# Patient Record
Sex: Female | Born: 1935 | Smoking: Never smoker
Health system: Southern US, Community
[De-identification: ages and names within clinical notes are randomized; demographics above are authoritative.]

## PROBLEM LIST (undated history)

## (undated) DIAGNOSIS — E1165 Type 2 diabetes mellitus with hyperglycemia: Secondary | ICD-10-CM

## (undated) DIAGNOSIS — M1990 Unspecified osteoarthritis, unspecified site: Secondary | ICD-10-CM

## (undated) DIAGNOSIS — I10 Essential (primary) hypertension: Secondary | ICD-10-CM

## (undated) DIAGNOSIS — IMO0001 Reserved for inherently not codable concepts without codable children: Secondary | ICD-10-CM

## (undated) DIAGNOSIS — Z794 Long term (current) use of insulin: Secondary | ICD-10-CM

## (undated) DIAGNOSIS — J45909 Unspecified asthma, uncomplicated: Secondary | ICD-10-CM

## (undated) DIAGNOSIS — M109 Gout, unspecified: Secondary | ICD-10-CM

## (undated) DIAGNOSIS — E785 Hyperlipidemia, unspecified: Secondary | ICD-10-CM

## (undated) DIAGNOSIS — N183 Chronic kidney disease, stage 3 unspecified: Secondary | ICD-10-CM

## (undated) DIAGNOSIS — I5032 Chronic diastolic (congestive) heart failure: Secondary | ICD-10-CM

## (undated) HISTORY — PX: OVARIAN CYST REMOVAL: SHX89

---

## 2003-10-24 ENCOUNTER — Other Ambulatory Visit: Payer: Self-pay

## 2004-02-10 ENCOUNTER — Other Ambulatory Visit: Payer: Self-pay

## 2004-02-15 ENCOUNTER — Other Ambulatory Visit: Payer: Self-pay

## 2004-08-03 ENCOUNTER — Ambulatory Visit: Payer: Self-pay | Admitting: Family Medicine

## 2004-08-25 ENCOUNTER — Ambulatory Visit: Payer: Self-pay | Admitting: Family Medicine

## 2005-06-05 ENCOUNTER — Emergency Department: Payer: Self-pay | Admitting: Emergency Medicine

## 2005-06-20 ENCOUNTER — Ambulatory Visit: Payer: Self-pay | Admitting: Nephrology

## 2005-07-30 ENCOUNTER — Ambulatory Visit: Payer: Self-pay | Admitting: Family Medicine

## 2005-11-16 ENCOUNTER — Other Ambulatory Visit: Payer: Self-pay

## 2005-11-17 ENCOUNTER — Inpatient Hospital Stay: Payer: Self-pay

## 2006-01-21 ENCOUNTER — Other Ambulatory Visit: Payer: Self-pay

## 2006-01-21 ENCOUNTER — Emergency Department: Payer: Self-pay | Admitting: Emergency Medicine

## 2006-12-16 ENCOUNTER — Emergency Department: Payer: Self-pay | Admitting: Emergency Medicine

## 2007-01-05 ENCOUNTER — Emergency Department: Payer: Self-pay

## 2007-01-05 ENCOUNTER — Ambulatory Visit: Payer: Self-pay

## 2007-07-20 ENCOUNTER — Emergency Department: Payer: Self-pay | Admitting: Internal Medicine

## 2007-07-20 ENCOUNTER — Other Ambulatory Visit: Payer: Self-pay

## 2011-03-21 ENCOUNTER — Encounter: Payer: Self-pay | Admitting: Nurse Practitioner

## 2011-03-21 ENCOUNTER — Encounter: Payer: Self-pay | Admitting: Cardiothoracic Surgery

## 2011-03-21 ENCOUNTER — Ambulatory Visit: Payer: Self-pay | Admitting: Nurse Practitioner

## 2011-03-28 ENCOUNTER — Encounter: Payer: Self-pay | Admitting: Cardiothoracic Surgery

## 2011-03-28 ENCOUNTER — Encounter: Payer: Self-pay | Admitting: Nurse Practitioner

## 2011-04-16 ENCOUNTER — Ambulatory Visit: Payer: Self-pay

## 2011-04-27 ENCOUNTER — Encounter: Payer: Self-pay | Admitting: Cardiothoracic Surgery

## 2011-04-27 ENCOUNTER — Encounter: Payer: Self-pay | Admitting: Nurse Practitioner

## 2011-05-31 ENCOUNTER — Ambulatory Visit: Payer: Self-pay | Admitting: Surgery

## 2011-06-01 ENCOUNTER — Encounter: Payer: Self-pay | Admitting: Cardiothoracic Surgery

## 2011-06-28 ENCOUNTER — Encounter: Payer: Self-pay | Admitting: Nurse Practitioner

## 2011-06-28 ENCOUNTER — Encounter: Payer: Self-pay | Admitting: Cardiothoracic Surgery

## 2011-07-26 ENCOUNTER — Encounter: Payer: Self-pay | Admitting: Nurse Practitioner

## 2011-07-26 ENCOUNTER — Encounter: Payer: Self-pay | Admitting: Cardiothoracic Surgery

## 2011-07-29 ENCOUNTER — Ambulatory Visit: Payer: Self-pay | Admitting: Internal Medicine

## 2011-08-26 ENCOUNTER — Encounter: Payer: Self-pay | Admitting: Cardiothoracic Surgery

## 2011-08-26 ENCOUNTER — Encounter: Payer: Self-pay | Admitting: Nurse Practitioner

## 2011-08-28 ENCOUNTER — Ambulatory Visit: Payer: Self-pay | Admitting: Cardiothoracic Surgery

## 2011-09-25 ENCOUNTER — Encounter: Payer: Self-pay | Admitting: Nurse Practitioner

## 2011-09-25 ENCOUNTER — Encounter: Payer: Self-pay | Admitting: Cardiothoracic Surgery

## 2011-11-14 ENCOUNTER — Ambulatory Visit: Payer: Self-pay | Admitting: Surgery

## 2011-11-14 DIAGNOSIS — E119 Type 2 diabetes mellitus without complications: Secondary | ICD-10-CM

## 2011-11-14 LAB — CBC WITH DIFFERENTIAL/PLATELET
Eosinophil #: 0.1 10*3/uL (ref 0.0–0.7)
Eosinophil %: 2.3 %
HCT: 35.3 % (ref 35.0–47.0)
Lymphocyte #: 2 10*3/uL (ref 1.0–3.6)
MCV: 86 fL (ref 80–100)
Monocyte %: 6.9 %
Neutrophil #: 3.6 10*3/uL (ref 1.4–6.5)
Neutrophil %: 58.6 %
Platelet: 236 10*3/uL (ref 150–440)
RBC: 4.08 10*6/uL (ref 3.80–5.20)
WBC: 6.2 10*3/uL (ref 3.6–11.0)

## 2011-11-14 LAB — BASIC METABOLIC PANEL
Anion Gap: 5 — ABNORMAL LOW (ref 7–16)
BUN: 44 mg/dL — ABNORMAL HIGH (ref 7–18)
Calcium, Total: 9 mg/dL (ref 8.5–10.1)
Co2: 29 mmol/L (ref 21–32)
EGFR (African American): 32 — ABNORMAL LOW
EGFR (Non-African Amer.): 27 — ABNORMAL LOW
Potassium: 5.2 mmol/L — ABNORMAL HIGH (ref 3.5–5.1)
Sodium: 138 mmol/L (ref 136–145)

## 2011-11-22 ENCOUNTER — Ambulatory Visit: Payer: Self-pay | Admitting: Surgery

## 2011-11-25 LAB — PATHOLOGY REPORT

## 2012-06-08 ENCOUNTER — Encounter: Payer: Self-pay | Admitting: Cardiothoracic Surgery

## 2012-06-08 ENCOUNTER — Encounter: Payer: Self-pay | Admitting: Nurse Practitioner

## 2012-06-27 ENCOUNTER — Encounter: Payer: Self-pay | Admitting: Nurse Practitioner

## 2012-06-27 ENCOUNTER — Encounter: Payer: Self-pay | Admitting: Cardiothoracic Surgery

## 2012-07-16 ENCOUNTER — Ambulatory Visit: Payer: Self-pay | Admitting: Internal Medicine

## 2012-07-25 ENCOUNTER — Encounter: Payer: Self-pay | Admitting: Cardiothoracic Surgery

## 2012-07-25 ENCOUNTER — Encounter: Payer: Self-pay | Admitting: Nurse Practitioner

## 2012-08-25 ENCOUNTER — Encounter: Payer: Self-pay | Admitting: Cardiothoracic Surgery

## 2012-08-25 ENCOUNTER — Encounter: Payer: Self-pay | Admitting: Nurse Practitioner

## 2012-09-24 ENCOUNTER — Encounter: Payer: Self-pay | Admitting: Nurse Practitioner

## 2012-09-24 ENCOUNTER — Encounter: Payer: Self-pay | Admitting: Cardiothoracic Surgery

## 2012-10-25 ENCOUNTER — Encounter: Payer: Self-pay | Admitting: Nurse Practitioner

## 2012-10-25 ENCOUNTER — Encounter: Payer: Self-pay | Admitting: Cardiothoracic Surgery

## 2012-11-24 ENCOUNTER — Encounter: Payer: Self-pay | Admitting: Cardiothoracic Surgery

## 2012-11-24 ENCOUNTER — Encounter: Payer: Self-pay | Admitting: Nurse Practitioner

## 2013-01-01 IMAGING — CR RIGHT HIP - COMPLETE 2+ VIEW
1 series · 2 of 2 positions shown · non-contrast
Comparison: none

REASON FOR EXAM: rapidly increasing wound depth of pressure ulcer  Fax to
Dr. Mercy [phone]
COMMENTS:

PROCEDURE:     DXR - DXR HIP RIGHT COMPLETE  - August 28, 2011  [DATE]
RESULT:     AP and frog-leg views of the right hip are obtained. An show the
femoral head located in the acetabulum. Degenerative changes are seen. No
definite fractures identified.

[Series 3: t hip ap right · 0.14mm/px · 2 of 2 slices shown]
[im 1/2]
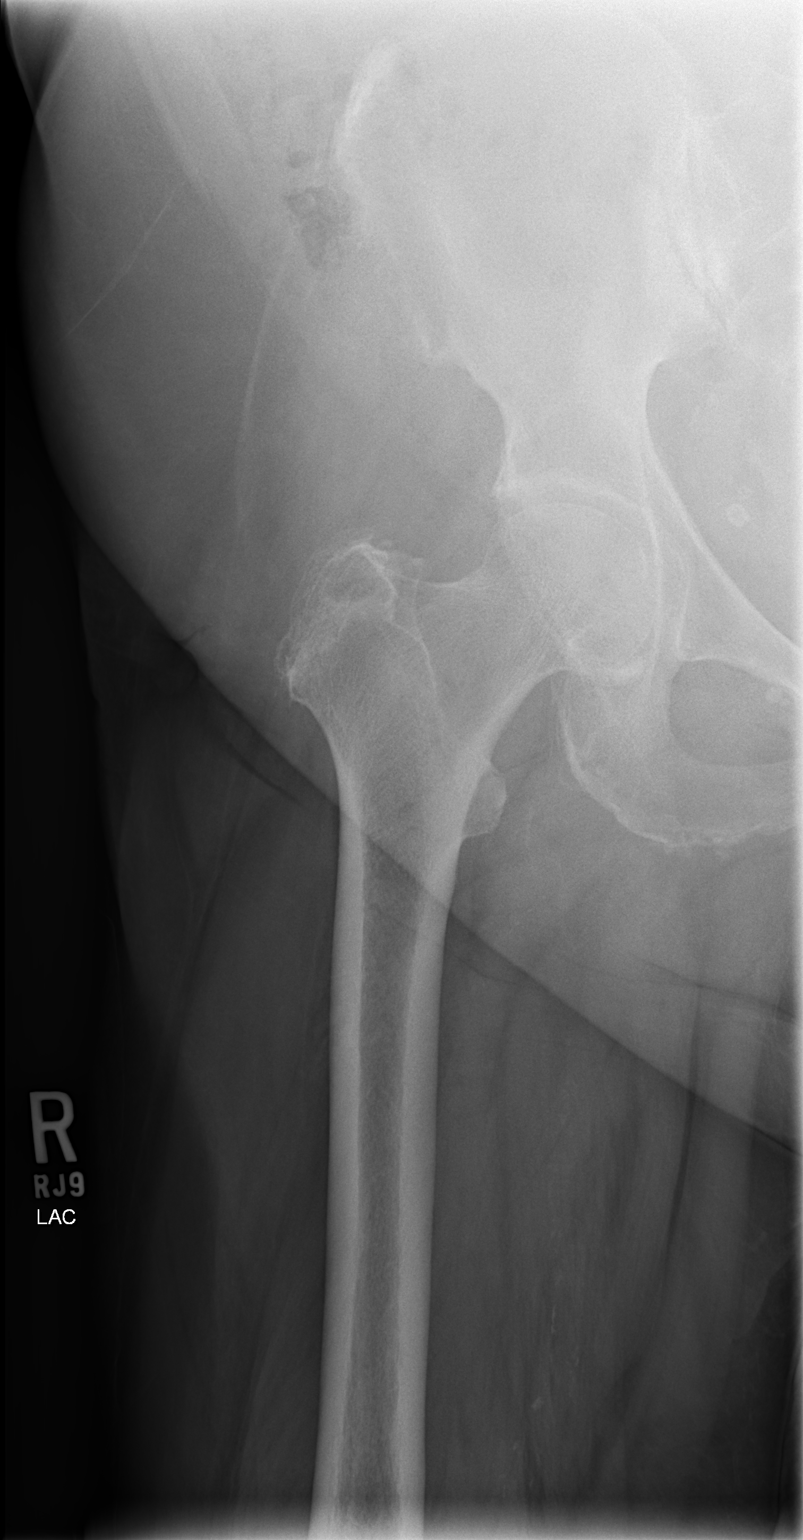
[im 2/2]
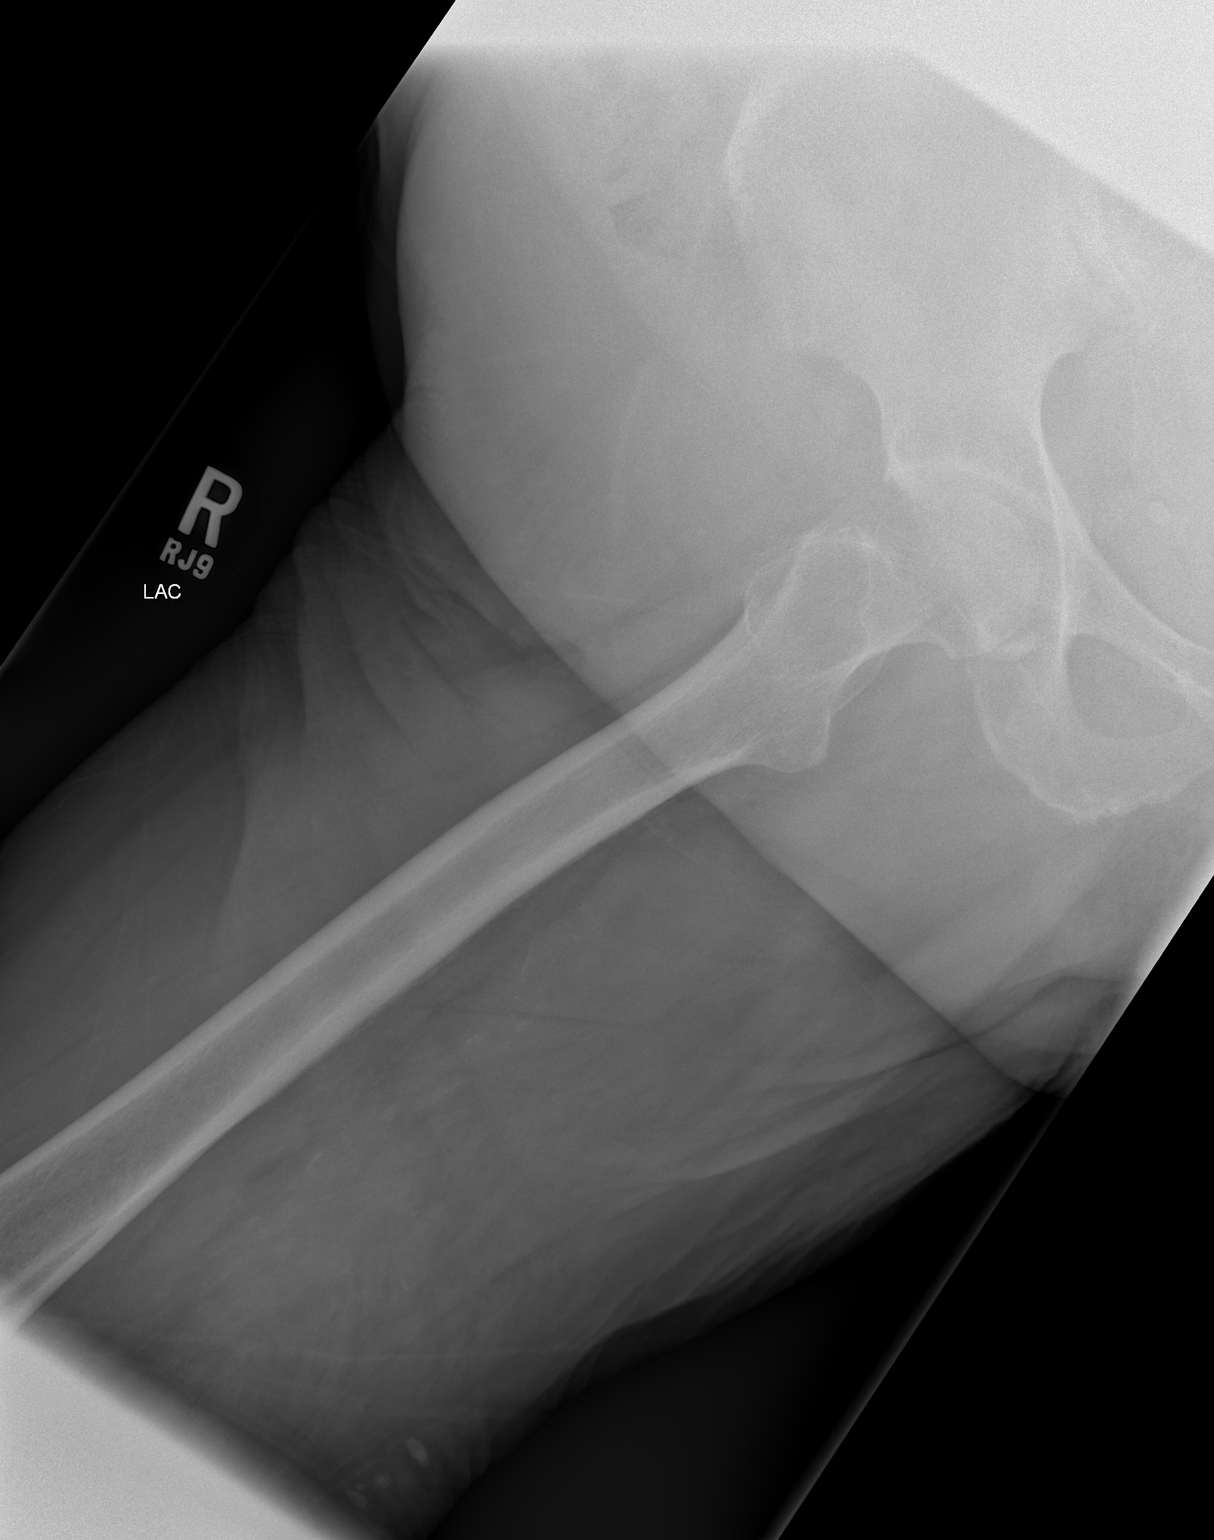

[2 of 2 positions shown; findings below may reference images not displayed]

IMPRESSION: 1. No acute bony abnormality. DJD present. No evidence of bony destruction.

## 2013-05-29 ENCOUNTER — Emergency Department: Payer: Self-pay | Admitting: Emergency Medicine

## 2014-09-18 NOTE — Op Note (Signed)
PATIENT NAME:  [name], [name] MR#:  [id number] DATE OF BIRTH:  [birth year]  DATE OF PROCEDURE:  11/22/2011  PREOPERATIVE DIAGNOSIS: Right hip decubitus ulcer.   POSTOPERATIVE DIAGNOSIS: Right hip decubitus ulcer.   PROCEDURE PERFORMED: Primary excision of decubitus ulcer measuring 7 cm with complex layered closure 7 cm long.   SURGEON: Aalina Brege A. Egbert Garibaldi, MD FACS  ASSISTANT: None.   ANESTHESIA: General and 20 mL of 0.25% plain Marcaine.   INDICATIONS: This is an 81 year old white female with a long-standing right hip decubitus ulcer overlying the greater trochanter. Recent MRI demonstrated no evidence of osteomyelitis. Despite aggressive wound care she has a persistent tract extending superiorly from the wound bed which is nonhealing. As such she was brought to the Operating Room for excision of the tract, biopsy and possible wound VAC versus primary closure.   FINDINGS: There was approximately 3 to 4 cm long tract from the center of the wound extending superiorly to the iliotibial band. No involvement of the hip space. There was no evidence of undrained purulence. No signs of infection.   SPECIMENS: As described above.   DRAINS: 7 French Jackson-Pratt.   DESCRIPTION OF PROCEDURE: With the patient in the supine position, general oral endotracheal anesthesia was induced. The patient was then positioned and padded in left side down decubitus position with arm board, axillary roll, appropriate padding and beanbag support. The existing dressing was removed. The packing was removed. The wound around the right hip was sterilely prepped and draped with Betadine solution.   Timeout was observed. I injected dilute methylene blue into the tract. An elliptical incision encompassing the wound measuring approximately 7 cm was fashioned with scalpel and carried down with sharp dissection through scar encompassing the entire tract. The specimen was submitted to pathology. Hemostasis being obtained the wound was  palpated. There was no evidence of further extension into the iliotibial band. All traces of the methylene blue were removed. The wound was then irrigated. Point hemostasis was obtained.   A total of 20 mL of 0.25% plain Marcaine was infiltrated along the wound. In order to obtain a tension-free closure the subcutaneous tissues were released off the fascia circumferentially with electrocautery. A 7 French Jackson-Pratt drain was directed into the space and exited the leg inferiorly. Drain site was secured with 3-0 nylon. The wound was then closed in multiple layers with 2-0 Vicryl, 3-0 Vicryl and 3-0 nylon in simple and vertical mattress orientation in the skin. A sterile occlusive compressive dressing was then placed. The patient was then subsequently returned supine, extubated and taken to the recovery room in stable and satisfactory condition by anesthesia services.   ____________________________ Redge Gainer Egbert Garibaldi, MD FACS mab:cms D: 11/22/2011 09:08:54 ET T: 11/22/2011 10:54:43 ET JOB#: [id number]  cc: Loraine Leriche A. Egbert Garibaldi, MD, <Dictator> Hyman Hopes, MD [facility] Jeremaine Maraj A Raidyn Wassink MD ELECTRONICALLY SIGNED 11/22/2011 18:01

## 2015-04-06 ENCOUNTER — Other Ambulatory Visit: Payer: Self-pay | Admitting: Otolaryngology

## 2015-04-06 DIAGNOSIS — H9202 Otalgia, left ear: Secondary | ICD-10-CM

## 2015-04-06 DIAGNOSIS — M542 Cervicalgia: Secondary | ICD-10-CM

## 2015-04-13 ENCOUNTER — Ambulatory Visit: Payer: Medicare Other | Attending: Otolaryngology

## 2015-06-20 ENCOUNTER — Other Ambulatory Visit: Payer: Self-pay | Admitting: Internal Medicine

## 2015-06-20 DIAGNOSIS — R590 Localized enlarged lymph nodes: Secondary | ICD-10-CM

## 2015-06-26 ENCOUNTER — Ambulatory Visit: Payer: Medicare Other

## 2015-10-31 ENCOUNTER — Inpatient Hospital Stay
Admission: EM | Admit: 2015-10-31 | Discharge: 2015-11-05 | DRG: 291 | Disposition: A | Discharge status: Home / Self Care | Payer: Medicare Other | Attending: Internal Medicine | Admitting: Internal Medicine

## 2015-10-31 ENCOUNTER — Encounter: Payer: Self-pay | Admitting: Internal Medicine

## 2015-10-31 ENCOUNTER — Emergency Department: Payer: Medicare Other

## 2015-10-31 DIAGNOSIS — Z882 Allergy status to sulfonamides status: Secondary | ICD-10-CM

## 2015-10-31 DIAGNOSIS — D638 Anemia in other chronic diseases classified elsewhere: Secondary | ICD-10-CM | POA: Diagnosis present

## 2015-10-31 DIAGNOSIS — Z808 Family history of malignant neoplasm of other organs or systems: Secondary | ICD-10-CM

## 2015-10-31 DIAGNOSIS — N183 Chronic kidney disease, stage 3 unspecified: Secondary | ICD-10-CM | POA: Diagnosis present

## 2015-10-31 DIAGNOSIS — E1122 Type 2 diabetes mellitus with diabetic chronic kidney disease: Secondary | ICD-10-CM | POA: Diagnosis present

## 2015-10-31 DIAGNOSIS — M1990 Unspecified osteoarthritis, unspecified site: Secondary | ICD-10-CM | POA: Diagnosis present

## 2015-10-31 DIAGNOSIS — E785 Hyperlipidemia, unspecified: Secondary | ICD-10-CM | POA: Diagnosis not present

## 2015-10-31 DIAGNOSIS — H9202 Otalgia, left ear: Secondary | ICD-10-CM | POA: Diagnosis present

## 2015-10-31 DIAGNOSIS — I5032 Chronic diastolic (congestive) heart failure: Secondary | ICD-10-CM | POA: Diagnosis present

## 2015-10-31 DIAGNOSIS — R9431 Abnormal electrocardiogram [ECG] [EKG]: Secondary | ICD-10-CM | POA: Diagnosis present

## 2015-10-31 DIAGNOSIS — Z888 Allergy status to other drugs, medicaments and biological substances status: Secondary | ICD-10-CM

## 2015-10-31 DIAGNOSIS — I10 Essential (primary) hypertension: Secondary | ICD-10-CM | POA: Diagnosis present

## 2015-10-31 DIAGNOSIS — E1121 Type 2 diabetes mellitus with diabetic nephropathy: Secondary | ICD-10-CM | POA: Diagnosis present

## 2015-10-31 DIAGNOSIS — I509 Heart failure, unspecified: Secondary | ICD-10-CM | POA: Diagnosis not present

## 2015-10-31 DIAGNOSIS — Z6841 Body Mass Index (BMI) 40.0 and over, adult: Secondary | ICD-10-CM | POA: Diagnosis not present

## 2015-10-31 DIAGNOSIS — E875 Hyperkalemia: Secondary | ICD-10-CM | POA: Diagnosis present

## 2015-10-31 DIAGNOSIS — Z887 Allergy status to serum and vaccine status: Secondary | ICD-10-CM

## 2015-10-31 DIAGNOSIS — M26629 Arthralgia of temporomandibular joint, unspecified side: Secondary | ICD-10-CM | POA: Diagnosis present

## 2015-10-31 DIAGNOSIS — Z79899 Other long term (current) drug therapy: Secondary | ICD-10-CM

## 2015-10-31 DIAGNOSIS — R0602 Shortness of breath: Secondary | ICD-10-CM | POA: Diagnosis not present

## 2015-10-31 DIAGNOSIS — E1142 Type 2 diabetes mellitus with diabetic polyneuropathy: Secondary | ICD-10-CM | POA: Diagnosis present

## 2015-10-31 DIAGNOSIS — Z79891 Long term (current) use of opiate analgesic: Secondary | ICD-10-CM | POA: Diagnosis not present

## 2015-10-31 DIAGNOSIS — N184 Chronic kidney disease, stage 4 (severe): Secondary | ICD-10-CM | POA: Diagnosis present

## 2015-10-31 DIAGNOSIS — E1165 Type 2 diabetes mellitus with hyperglycemia: Secondary | ICD-10-CM | POA: Diagnosis present

## 2015-10-31 DIAGNOSIS — N179 Acute kidney failure, unspecified: Secondary | ICD-10-CM | POA: Diagnosis present

## 2015-10-31 DIAGNOSIS — I5033 Acute on chronic diastolic (congestive) heart failure: Secondary | ICD-10-CM | POA: Diagnosis present

## 2015-10-31 DIAGNOSIS — H9209 Otalgia, unspecified ear: Secondary | ICD-10-CM | POA: Diagnosis present

## 2015-10-31 DIAGNOSIS — Z794 Long term (current) use of insulin: Secondary | ICD-10-CM | POA: Diagnosis not present

## 2015-10-31 DIAGNOSIS — M109 Gout, unspecified: Secondary | ICD-10-CM | POA: Diagnosis present

## 2015-10-31 DIAGNOSIS — J45909 Unspecified asthma, uncomplicated: Secondary | ICD-10-CM | POA: Diagnosis present

## 2015-10-31 DIAGNOSIS — I130 Hypertensive heart and chronic kidney disease with heart failure and stage 1 through stage 4 chronic kidney disease, or unspecified chronic kidney disease: Principal | ICD-10-CM | POA: Diagnosis present

## 2015-10-31 DIAGNOSIS — Z880 Allergy status to penicillin: Secondary | ICD-10-CM

## 2015-10-31 DIAGNOSIS — Z9889 Other specified postprocedural states: Secondary | ICD-10-CM

## 2015-10-31 DIAGNOSIS — E119 Type 2 diabetes mellitus without complications: Secondary | ICD-10-CM

## 2015-10-31 DIAGNOSIS — R600 Localized edema: Secondary | ICD-10-CM | POA: Diagnosis present

## 2015-10-31 HISTORY — DX: Chronic kidney disease, stage 3 unspecified: N18.30

## 2015-10-31 HISTORY — DX: Unspecified osteoarthritis, unspecified site: M19.90

## 2015-10-31 HISTORY — DX: Hyperlipidemia, unspecified: E78.5

## 2015-10-31 HISTORY — DX: Gout, unspecified: M10.9

## 2015-10-31 HISTORY — DX: Long term (current) use of insulin: Z79.4

## 2015-10-31 HISTORY — DX: Type 2 diabetes mellitus with hyperglycemia: E11.65

## 2015-10-31 HISTORY — DX: Chronic diastolic (congestive) heart failure: I50.32

## 2015-10-31 HISTORY — DX: Chronic kidney disease, stage 3 (moderate): N18.3

## 2015-10-31 HISTORY — DX: Unspecified asthma, uncomplicated: J45.909

## 2015-10-31 HISTORY — DX: Morbid (severe) obesity due to excess calories: E66.01

## 2015-10-31 HISTORY — DX: Essential (primary) hypertension: I10

## 2015-10-31 HISTORY — DX: Reserved for inherently not codable concepts without codable children: IMO0001

## 2015-10-31 LAB — CBC
HEMATOCRIT: 34.4 % — AB (ref 35.0–47.0)
Hemoglobin: 10.9 g/dL — ABNORMAL LOW (ref 12.0–16.0)
MCH: 26.7 pg (ref 26.0–34.0)
MCHC: 31.6 g/dL — AB (ref 32.0–36.0)
MCV: 84.6 fL (ref 80.0–100.0)
Platelets: 225 10*3/uL (ref 150–440)
RBC: 4.06 MIL/uL (ref 3.80–5.20)
RDW: 18.6 % — AB (ref 11.5–14.5)
WBC: 8 10*3/uL (ref 3.6–11.0)

## 2015-10-31 LAB — BASIC METABOLIC PANEL
Anion gap: 7 (ref 5–15)
BUN: 82 mg/dL — ABNORMAL HIGH (ref 6–20)
CALCIUM: 8.7 mg/dL — AB (ref 8.9–10.3)
CO2: 24 mmol/L (ref 22–32)
CREATININE: 2.18 mg/dL — AB (ref 0.44–1.00)
Chloride: 110 mmol/L (ref 101–111)
GFR calc non Af Amer: 20 mL/min — ABNORMAL LOW (ref >60)
GFR, EST AFRICAN AMERICAN: 23 mL/min — AB (ref >60)
Glucose, Bld: 133 mg/dL — ABNORMAL HIGH (ref 65–99)
Potassium: 5.7 mmol/L — ABNORMAL HIGH (ref 3.5–5.1)
Sodium: 141 mmol/L (ref 135–145)

## 2015-10-31 LAB — GLUCOSE, CAPILLARY: GLUCOSE-CAPILLARY: 124 mg/dL — AB (ref 65–99)

## 2015-10-31 LAB — TROPONIN I: TROPONIN I: 0.03 ng/mL (ref <0.031)

## 2015-10-31 LAB — BRAIN NATRIURETIC PEPTIDE: B Natriuretic Peptide: 183 pg/mL — ABNORMAL HIGH (ref 0.0–100.0)

## 2015-10-31 MED ORDER — ONDANSETRON HCL 4 MG/2ML IJ SOLN: 4.0000 mg | Freq: Four times a day (QID) | INTRAMUSCULAR | Status: DC | PRN

## 2015-10-31 MED ORDER — OXYCODONE-ACETAMINOPHEN 5-325 MG PO TABS
1.0000 | ORAL_TABLET | Freq: Four times a day (QID) | ORAL | Status: DC | PRN
  Administered 2015-11-01: 1 via ORAL
  Filled 2015-10-31: qty 1

## 2015-10-31 MED ORDER — ONDANSETRON HCL 4 MG PO TABS: 4.0000 mg | ORAL_TABLET | Freq: Four times a day (QID) | ORAL | Status: DC | PRN

## 2015-10-31 MED ORDER — LABETALOL HCL 5 MG/ML IV SOLN: 10.0000 mg | INTRAVENOUS | Status: DC | PRN

## 2015-10-31 MED ORDER — METOPROLOL TARTRATE 50 MG PO TABS
50.0000 mg | ORAL_TABLET | Freq: Two times a day (BID) | ORAL | Status: DC
  Administered 2015-11-01 – 2015-11-05 (×10): 50 mg via ORAL
  Filled 2015-10-31 (×10): qty 1

## 2015-10-31 MED ORDER — ALBUTEROL SULFATE (2.5 MG/3ML) 0.083% IN NEBU
3.0000 mL | INHALATION_SOLUTION | RESPIRATORY_TRACT | Status: DC | PRN
  Administered 2015-11-01: 3 mL via RESPIRATORY_TRACT
  Filled 2015-10-31: qty 3

## 2015-10-31 MED ORDER — ACETAMINOPHEN 650 MG RE SUPP: 650.0000 mg | Freq: Four times a day (QID) | RECTAL | Status: DC | PRN

## 2015-10-31 MED ORDER — INSULIN ASPART 100 UNIT/ML ~~LOC~~ SOLN: 0.0000 [IU] | Freq: Every day | SUBCUTANEOUS | Status: DC

## 2015-10-31 MED ORDER — FUROSEMIDE 10 MG/ML IJ SOLN
40.0000 mg | Freq: Once | INTRAMUSCULAR | Status: AC
  Administered 2015-10-31: 40 mg via INTRAVENOUS
  Filled 2015-10-31: qty 4

## 2015-10-31 MED ORDER — INSULIN ASPART 100 UNIT/ML ~~LOC~~ SOLN
0.0000 [IU] | Freq: Three times a day (TID) | SUBCUTANEOUS | Status: DC
  Administered 2015-11-01: 3 [IU] via SUBCUTANEOUS
  Filled 2015-10-31: qty 3

## 2015-10-31 MED ORDER — ACETAMINOPHEN 325 MG PO TABS: 650.0000 mg | ORAL_TABLET | Freq: Four times a day (QID) | ORAL | Status: DC | PRN

## 2015-10-31 MED ORDER — GABAPENTIN 300 MG PO CAPS
300.0000 mg | ORAL_CAPSULE | Freq: Two times a day (BID) | ORAL | Status: DC
  Administered 2015-11-01 – 2015-11-05 (×10): 300 mg via ORAL
  Filled 2015-10-31 (×10): qty 1

## 2015-10-31 MED ORDER — HEPARIN SODIUM (PORCINE) 5000 UNIT/ML IJ SOLN
5000.0000 [IU] | Freq: Three times a day (TID) | INTRAMUSCULAR | Status: DC
  Administered 2015-11-01 – 2015-11-04 (×13): 5000 [IU] via SUBCUTANEOUS
  Filled 2015-10-31 (×13): qty 1

## 2015-10-31 MED ORDER — SODIUM CHLORIDE 0.9% FLUSH
3.0000 mL | Freq: Two times a day (BID) | INTRAVENOUS | Status: DC
  Administered 2015-11-01 – 2015-11-04 (×9): 3 mL via INTRAVENOUS

## 2015-10-31 NOTE — ED Notes (Signed)
Patient had increased work of breathing and de sat into the mid 80's while walking with a walker. Patient's sats increased to 91% when put back on the stretcher. Audible expiratory wheezes noted when first placed on the stretcher. Patient was able to speak in complete sentences shortly after being placed back on the stretcher.

## 2015-10-31 NOTE — ED Notes (Signed)
Pt in with swelling to area anterior to left ear states for 6 months. Was dx with infected lymph node 6 months ago and give doxycycline.  States same symptoms have returned and has increased pain to area and left ear. Pt also co shob for 2 months some shob noted at present.

## 2015-10-31 NOTE — ED Provider Notes (Signed)
[facility] Emergency Department Provider Note   ____________________________________________  Time seen: ~2025  I have reviewed the triage vital signs and the nursing notes.   HISTORY  Chief Complaint Shortness of Breath   History limited by: Not Limited   HPI [name] is a 82 y.o. female resents to the emergency department today with primary complaints of shortness of breath. The shortness of breath started yesterday evening. Has progressively gotten worse since then. Is especially worse with ambulation. The patient has also noticed increased swelling in her lower legs. She feels she had a low-grade fever 2 days ago. Has history of some lung disorder and tried her albuterol inhaler without any relief.  The patient has additional complaint of some swelling and pain to her left neck and ear.     Past Medical History  Diagnosis Date  . Diabetes (HCC)   . CKD (chronic kidney disease), stage IV (HCC)   . HLD (hyperlipidemia)   . HTN (hypertension)   . Gout     There are no active problems to display for this patient.   Past Surgical History  Procedure Laterality Date  . Ovarian cyst removal      Current Outpatient Rx  Name  Route  Sig  Dispense  Refill  . albuterol (PROVENTIL HFA;VENTOLIN HFA) 108 (90 Base) MCG/ACT inhaler   Inhalation   Inhale 2 puffs into the lungs every 4 (four) hours as needed for wheezing or shortness of breath.         Marland Kitchen atorvastatin (LIPITOR) 20 MG tablet   Oral   Take 20 mg by mouth at bedtime.         . diphenoxylate-atropine (LOMOTIL) 2.5-0.025 MG tablet   Oral   Take 1-2 tablets by mouth 4 (four) times daily as needed for diarrhea or loose stools.         . furosemide (LASIX) 40 MG tablet   Oral   Take 40 mg by mouth daily.         Marland Kitchen gabapentin (NEURONTIN) 300 MG capsule   Oral   Take 300 mg by mouth 3 (three) times daily.         . insulin aspart (NOVOLOG) 100 UNIT/ML injection  Subcutaneous   Inject 32 Units into the skin 3 (three) times daily before meals.          . insulin detemir (LEVEMIR) 100 UNIT/ML injection   Subcutaneous   Inject 72 Units into the skin at bedtime.         Marland Kitchen lisinopril (PRINIVIL,ZESTRIL) 40 MG tablet   Oral   Take 40 mg by mouth 2 (two) times daily.         . metoprolol (LOPRESSOR) 50 MG tablet   Oral   Take 50 mg by mouth 2 (two) times daily.         Marland Kitchen oxyCODONE-acetaminophen (PERCOCET/ROXICET) 5-325 MG tablet   Oral   Take 1 tablet by mouth every 6 (six) hours as needed for severe pain.            Allergies Influenza vaccines; Penicillins; and Sulfa antibiotics  No family history on file.  Social History Social History  Substance Use Topics  . Smoking status: Never Smoker   . Smokeless tobacco: Not on file  . Alcohol Use: No    Review of Systems  Constitutional: Positive for low grade fever Cardiovascular: Negative for chest pain. Respiratory: Positive for shortness of breath Gastrointestinal: Negative for abdominal pain, vomiting  and diarrhea. Neurological: Negative for headaches, focal weakness or numbness.  10-point ROS otherwise negative.  ____________________________________________   PHYSICAL EXAM:  VITAL SIGNS: ED Triage Vitals  Enc Vitals Group     BP 10/31/15 1930 163/50 mmHg     Pulse Rate 10/31/15 1930 71     Resp 10/31/15 1930 20     Temp 10/31/15 1930 98.1 F (36.7 C)     Temp Source 10/31/15 1930 Oral     SpO2 10/31/15 1930 94 %     Weight 10/31/15 1930 201 lb (91.173 kg)     Height 10/31/15 1930 4\' 11"  (1.499 m)     Head Cir --      Peak Flow --      Pain Score 10/31/15 1931 10   Constitutional: Alert and oriented. Mild increase in respiratory effort.  Eyes: Conjunctivae are normal. PERRL. Normal extraocular movements. ENT   Head: Normocephalic and atraumatic.   Nose: No congestion/rhinnorhea.   Mouth/Throat: Mucous membranes are moist.   Neck: No stridor.  Some swelling to the left cervical lymph nodes.  Cardiovascular: Normal rate, regular rhythm.  No murmurs, rubs, or gallops. Respiratory: Mild increase in respiratory effort. Auto-peeping. No wheezes/rales/rhonchi. Gastrointestinal: Soft and nontender. No distention. There is no CVA tenderness. Genitourinary: Deferred Musculoskeletal: Normal range of motion in all extremities. No joint effusions.  No lower extremity tenderness nor edema. Neurologic:  Normal speech and language. No gross focal neurologic deficits are appreciated.  Skin:  Skin is warm, dry and intact. No rash noted. Psychiatric: Mood and affect are normal. Speech and behavior are normal. Patient exhibits appropriate insight and judgment.  ____________________________________________    LABS (pertinent positives/negatives)  Labs Reviewed  CBC - Abnormal; Notable for the following:    Hemoglobin 10.9 (*)    HCT 34.4 (*)    MCHC 31.6 (*)    RDW 18.6 (*)    All other components within normal limits  BASIC METABOLIC PANEL - Abnormal; Notable for the following:    Potassium 5.7 (*)    Glucose, Bld 133 (*)    BUN 82 (*)    Creatinine, Ser 2.18 (*)    Calcium 8.7 (*)    GFR calc non Af Amer 20 (*)    GFR calc Af Amer 23 (*)    All other components within normal limits  BRAIN NATRIURETIC PEPTIDE - Abnormal; Notable for the following:    B Natriuretic Peptide 183.0 (*)    All other components within normal limits  TROPONIN I     ____________________________________________   EKG  I, 12/31/15, attending physician, personally viewed and interpreted this EKG  EKG Time: 1932 Rate: 71 Rhythm: normal sinus rhythm Axis: normal Intervals: qtc 421 QRS: LVH, q waves V3 ST changes: no st elevation Impression: abnormal ekg   ____________________________________________    RADIOLOGY  CXR IMPRESSION: Prominent interstitial markings, which may be secondary to low lung volumes. Alternatively these may  represent a mild pulmonary vascular congestion.  Left lung base atelectasis.  ____________________________________________   PROCEDURES  Procedure(s) performed: None  Critical Care performed: No  ____________________________________________   INITIAL IMPRESSION / ASSESSMENT AND PLAN / ED COURSE  Pertinent labs & imaging results that were available during my care of the patient were reviewed by me and considered in my medical decision making (see chart for details).  Patient presents to the emergency department today because of concerns for shortness of breath started yesterday evening. Chest x-ray does show some congestion. Additionally BNP was  mildly elevated. On ambulation the patient desatted into the 80s. Given these findings patient will be admitted to the hospital service for further workup and evaluation. Patient was given IV Lasix.  ____________________________________________   FINAL CLINICAL IMPRESSION(S) / ED DIAGNOSES  Final diagnoses:  Shortness of breath     Note: This dictation was prepared with Dragon dictation. Any transcriptional errors that result from this process are unintentional    Phineas Semen, MD 10/31/15 2232

## 2015-10-31 NOTE — ED Notes (Signed)
Patient was incontinent of urine. Patient was cleaned and brief and linens were changed.

## 2015-10-31 NOTE — H&P (Signed)
[facility] [place] [facility]   PATIENT NAME: [name]    MR#:  [id number]  DATE OF BIRTH:  Nov 08, 1928  DATE OF ADMISSION:  10/31/2015  PRIMARY CARE PHYSICIAN: Hyman Hopes, MD   REQUESTING/REFERRING PHYSICIAN: Derrill Kay, MD  CHIEF COMPLAINT:   Chief Complaint  Patient presents with  . Shortness of Breath    HISTORY OF PRESENT ILLNESS:  [name]  is a 82 y.o. female who presents with Several months of progressive dyspnea on exertion and shortness of breath at rest. Patient states that she is currently reached a point where she is unable to traverse even her ED room without becoming short of breath. She also states now that when she lays down flat at night she often wakes up with a sensation that she cannot catch her breath or get oxygen. She's had progressive lower extremity edema. She's had this for some time and was told it was due to her kidney disease, but it has gotten worse the last couple of months. Workup in the ED was suggestive of the same. Her BNP was mildly elevated, but her chest x-ray showed vascular congestion. Hospitalists were called for admission further evaluation. Of note she also states that she has left ear pain which is recurrent, and she feels like she has some left facial swelling along the angle of her jaw by her ear.  PAST MEDICAL HISTORY:   Past Medical History  Diagnosis Date  . Diabetes (HCC)   . CKD (chronic kidney disease), stage IV (HCC)   . HLD (hyperlipidemia)   . HTN (hypertension)   . Gout     PAST SURGICAL HISTORY:   Past Surgical History  Procedure Laterality Date  . Ovarian cyst removal      SOCIAL HISTORY:   Social History  Substance Use Topics  . Smoking status: Never Smoker   . Smokeless tobacco: Not on file  . Alcohol Use: No    FAMILY HISTORY:   Family History  Problem Relation Age of Onset  . Melanoma Mother     DRUG ALLERGIES:   Allergies  Allergen Reactions  .  Amitriptyline Nausea And Vomiting  . Influenza Vaccines Other (See Comments)    Reaction:  Unknown   . Penicillins Itching and Other (See Comments)    Has patient had a PCN reaction causing immediate rash, facial/tongue/throat swelling, SOB or lightheadedness with hypotension: No Has patient had a PCN reaction causing severe rash involving mucus membranes or skin necrosis: No Has patient had a PCN reaction that required hospitalization No Has patient had a PCN reaction occurring within the last 10 years: No If all of the above answers are "NO", then may proceed with Cephalosporin use.  . Pneumococcal Vaccines Other (See Comments)    Reaction:  Unknown   . Rosiglitazone Other (See Comments)    Reaction:  Unknown   . Sulfa Antibiotics Itching    MEDICATIONS AT HOME:   Prior to Admission medications   Medication Sig Start Date End Date Taking? Authorizing Provider  albuterol (PROVENTIL HFA;VENTOLIN HFA) 108 (90 Base) MCG/ACT inhaler Inhale 2 puffs into the lungs every 4 (four) hours as needed for wheezing or shortness of breath.   Yes Historical Provider, MD  diphenoxylate-atropine (LOMOTIL) 2.5-0.025 MG tablet Take 1-2 tablets by mouth 4 (four) times daily as needed for diarrhea or loose stools.   Yes Historical Provider, MD  furosemide (LASIX) 40 MG tablet Take 40 mg by mouth daily.   Yes  Historical Provider, MD  gabapentin (NEURONTIN) 300 MG capsule Take 300 mg by mouth 3 (three) times daily.   Yes Historical Provider, MD  insulin aspart (NOVOLOG) 100 UNIT/ML injection Inject 30 Units into the skin 3 (three) times daily before meals.    Yes Historical Provider, MD  insulin detemir (LEVEMIR) 100 UNIT/ML injection Inject 72 Units into the skin at bedtime.   Yes Historical Provider, MD  lisinopril (PRINIVIL,ZESTRIL) 40 MG tablet Take 40 mg by mouth 2 (two) times daily.   Yes Historical Provider, MD  metoprolol (LOPRESSOR) 50 MG tablet Take 50 mg by mouth 2 (two) times daily.   Yes Historical  Provider, MD  oxyCODONE-acetaminophen (PERCOCET/ROXICET) 5-325 MG tablet Take 1 tablet by mouth every 6 (six) hours as needed for severe pain.    Yes Historical Provider, MD    REVIEW OF SYSTEMS:  Review of Systems  Constitutional: Negative for fever, chills, weight loss and malaise/fatigue.  HENT: Positive for ear pain. Negative for hearing loss and tinnitus.   Eyes: Negative for blurred vision, double vision, pain and redness.  Respiratory: Positive for shortness of breath. Negative for cough and hemoptysis.   Cardiovascular: Positive for orthopnea and leg swelling. Negative for chest pain and palpitations.  Gastrointestinal: Negative for nausea, vomiting, abdominal pain, diarrhea and constipation.  Genitourinary: Negative for dysuria, frequency and hematuria.  Musculoskeletal: Negative for back pain, joint pain and neck pain.  Skin:       No acne, rash, or lesions  Neurological: Negative for dizziness, tremors, focal weakness and weakness.  Endo/Heme/Allergies: Negative for polydipsia. Does not bruise/bleed easily.  Psychiatric/Behavioral: Negative for depression. The patient is not nervous/anxious and does not have insomnia.      VITAL SIGNS:   Filed Vitals:   10/31/15 1930 10/31/15 2112  BP: 163/50 189/65  Pulse: 71 100  Temp: 98.1 F (36.7 C)   TempSrc: Oral   Resp: 20 24  Height: 4\' 11"  (1.499 m)   Weight: 91.173 kg (201 lb)   SpO2: 94% 95%   Wt Readings from Last 3 Encounters:  10/31/15 91.173 kg (201 lb)    PHYSICAL EXAMINATION:  Physical Exam  Vitals reviewed. Constitutional: She is oriented to person, place, and time. She appears well-developed and well-nourished. No distress.  HENT:  Head: Normocephalic and atraumatic.  Right Ear: External ear normal.  Left Ear: External ear normal.  Mouth/Throat: Oropharynx is clear and moist.  Eyes: Conjunctivae and EOM are normal. Pupils are equal, round, and reactive to light. No scleral icterus.  Neck: Normal range of  motion. Neck supple. No JVD present. No thyromegaly present.  Cardiovascular: Normal rate, regular rhythm and intact distal pulses.  Exam reveals no gallop and no friction rub.   No murmur heard. Respiratory: Effort normal and breath sounds normal. No respiratory distress. She has no wheezes. She has no rales.  GI: Soft. Bowel sounds are normal. She exhibits no distension. There is no tenderness.  Musculoskeletal: Normal range of motion. She exhibits no edema.  No arthritis, no gout  Lymphadenopathy:    She has no cervical adenopathy.  Neurological: She is alert and oriented to person, place, and time. No cranial nerve deficit.  No dysarthria, no aphasia  Skin: Skin is warm and dry. No rash noted. No erythema.  Left Soltis swelling  Psychiatric: She has a normal mood and affect. Her behavior is normal. Judgment and thought content normal.    LABORATORY PANEL:   CBC  Recent Labs Lab 10/31/15 1935  WBC  8.0  HGB 10.9*  HCT 34.4*  PLT 225   ------------------------------------------------------------------------------------------------------------------  Chemistries   Recent Labs Lab 10/31/15 1935  NA 141  K 5.7*  CL 110  CO2 24  GLUCOSE 133*  BUN 82*  CREATININE 2.18*  CALCIUM 8.7*   ------------------------------------------------------------------------------------------------------------------  Cardiac Enzymes  Recent Labs Lab 10/31/15 1935  TROPONINI 0.03   ------------------------------------------------------------------------------------------------------------------  RADIOLOGY:  Dg Chest 2 View  10/31/2015  CLINICAL DATA:  Shortness of breath for 2 months. EXAM: CHEST  2 VIEW COMPARISON:  07/29/2011 FINDINGS: Cardiomediastinal silhouette is normal. Mediastinal contours appear intact. There is no evidence of focal airspace consolidation, pleural effusion or pneumothorax. The lungs are low volume with exaggeration of the interstitial markings. There is  probable left lower lobe atelectasis. Osseous structures are without acute abnormality. Soft tissues are grossly normal. IMPRESSION: Prominent interstitial markings, which may be secondary to low lung volumes. Alternatively these may represent a mild pulmonary vascular congestion. Left lung base atelectasis. Electronically Signed   By: Ted Mcalpine M.D.   On: 10/31/2015 20:07    EKG:   Orders placed or performed during the hospital encounter of 10/31/15  . EKG 12-Lead  . EKG 12-Lead  . ED EKG  . ED EKG    IMPRESSION AND PLAN:  Principal Problem:   Shortness of breath - patient's symptoms seem very consistent clinically with congestive heart failure. She has no prior diagnosis of this. We will initiate workup for the same here including trending her cardiac enzymes tonight, getting echocardiogram, cardiology consult tomorrow. She was given a dose of IV Lasix in the ED, but given her kidney disease we will have to be very gentle with diuretic administration. Active Problems:   Lower extremity edema - likely related to heart failure at this considered above, workup as above   Diabetes (HCC) - siding scale insulin with corresponding glucose checks and carb modified diet   HTN (hypertension) - continue home meds, PRN antihypertensives in addition with a blood pressure goal less than 160/100   CKD (chronic kidney disease), stage IV (HCC) - avoid nephrotoxins, patient seems to be a baseline   Ear pain - will get ENT consult for further evaluation    All the records are reviewed and case discussed with ED provider. Management plans discussed with the patient and/or family.  DVT PROPHYLAXIS: SubQ heparin  GI PROPHYLAXIS: None  ADMISSION STATUS: Inpatient  CODE STATUS: Full Code Status History    This patient does not have a recorded code status. Please follow your organizational policy for patients in this situation.      TOTAL TIME TAKING CARE OF THIS PATIENT: 45 minutes.     Azizi Bally FIELDING 10/31/2015, 10:04 PM  [facility]  Office  [phone]  CC: Primary care physician; Hyman Hopes, MD

## 2015-11-01 ENCOUNTER — Inpatient Hospital Stay (HOSPITAL_COMMUNITY)
Admit: 2015-11-01 | Discharge: 2015-11-01 | Disposition: A | Discharge status: Home / Self Care | Payer: Medicare Other | Attending: Internal Medicine | Admitting: Internal Medicine

## 2015-11-01 DIAGNOSIS — J45909 Unspecified asthma, uncomplicated: Secondary | ICD-10-CM | POA: Diagnosis present

## 2015-11-01 DIAGNOSIS — R0602 Shortness of breath: Secondary | ICD-10-CM

## 2015-11-01 DIAGNOSIS — N183 Chronic kidney disease, stage 3 unspecified: Secondary | ICD-10-CM | POA: Diagnosis present

## 2015-11-01 DIAGNOSIS — R9431 Abnormal electrocardiogram [ECG] [EKG]: Secondary | ICD-10-CM

## 2015-11-01 DIAGNOSIS — E785 Hyperlipidemia, unspecified: Secondary | ICD-10-CM

## 2015-11-01 DIAGNOSIS — I10 Essential (primary) hypertension: Secondary | ICD-10-CM

## 2015-11-01 DIAGNOSIS — I509 Heart failure, unspecified: Secondary | ICD-10-CM

## 2015-11-01 DIAGNOSIS — I5032 Chronic diastolic (congestive) heart failure: Secondary | ICD-10-CM | POA: Diagnosis present

## 2015-11-01 LAB — GLUCOSE, CAPILLARY
GLUCOSE-CAPILLARY: 208 mg/dL — AB (ref 65–99)
GLUCOSE-CAPILLARY: 163 mg/dL — AB (ref 65–99)
GLUCOSE-CAPILLARY: 150 mg/dL — AB (ref 65–99)
Glucose-Capillary: 319 mg/dL — ABNORMAL HIGH (ref 65–99)
Glucose-Capillary: 215 mg/dL — ABNORMAL HIGH (ref 65–99)
Glucose-Capillary: 176 mg/dL — ABNORMAL HIGH (ref 65–99)

## 2015-11-01 LAB — ECHOCARDIOGRAM COMPLETE
HEIGHTINCHES: 59 in
WEIGHTICAEL: 3230.4 [oz_av]

## 2015-11-01 LAB — BASIC METABOLIC PANEL
ANION GAP: 8 (ref 5–15)
BUN: 78 mg/dL — ABNORMAL HIGH (ref 6–20)
CALCIUM: 8.9 mg/dL (ref 8.9–10.3)
CO2: 20 mmol/L — AB (ref 22–32)
CREATININE: 2.09 mg/dL — AB (ref 0.44–1.00)
Chloride: 111 mmol/L (ref 101–111)
GFR, EST AFRICAN AMERICAN: 24 mL/min — AB (ref >60)
GFR, EST NON AFRICAN AMERICAN: 21 mL/min — AB (ref >60)
Glucose, Bld: 238 mg/dL — ABNORMAL HIGH (ref 65–99)
Potassium: 5.7 mmol/L — ABNORMAL HIGH (ref 3.5–5.1)
SODIUM: 139 mmol/L (ref 135–145)

## 2015-11-01 LAB — HEMOGLOBIN A1C: HEMOGLOBIN A1C: 7.8 % — AB (ref 4.0–6.0)

## 2015-11-01 LAB — CBC
HCT: 36.5 % (ref 35.0–47.0)
HEMOGLOBIN: 11.5 g/dL — AB (ref 12.0–16.0)
MCH: 26.1 pg (ref 26.0–34.0)
MCHC: 31.6 g/dL — ABNORMAL LOW (ref 32.0–36.0)
MCV: 82.7 fL (ref 80.0–100.0)
PLATELETS: 255 10*3/uL (ref 150–440)
RBC: 4.42 MIL/uL (ref 3.80–5.20)
RDW: 18.5 % — ABNORMAL HIGH (ref 11.5–14.5)
WBC: 10.1 10*3/uL (ref 3.6–11.0)

## 2015-11-01 LAB — TROPONIN I
TROPONIN I: 0.04 ng/mL — AB (ref <0.031)
Troponin I: <0.03 ng/mL (ref <0.031)
Troponin I: 0.03 ng/mL (ref <0.031)

## 2015-11-01 MED ORDER — LOPERAMIDE HCL 2 MG PO CAPS
2.0000 mg | ORAL_CAPSULE | ORAL | Status: DC | PRN
  Administered 2015-11-01 (×2): 2 mg via ORAL
  Filled 2015-11-01 (×2): qty 1

## 2015-11-01 MED ORDER — HYDRALAZINE HCL 20 MG/ML IJ SOLN
10.0000 mg | INTRAMUSCULAR | Status: DC | PRN
  Administered 2015-11-01: 10 mg via INTRAVENOUS
  Filled 2015-11-01: qty 1

## 2015-11-01 MED ORDER — INSULIN ASPART 100 UNIT/ML ~~LOC~~ SOLN
10.0000 [IU] | Freq: Three times a day (TID) | SUBCUTANEOUS | Status: DC
  Administered 2015-11-01: 10 [IU] via SUBCUTANEOUS
  Filled 2015-11-01: qty 10

## 2015-11-01 MED ORDER — AMLODIPINE BESYLATE 10 MG PO TABS
10.0000 mg | ORAL_TABLET | Freq: Every day | ORAL | Status: DC
  Administered 2015-11-01 – 2015-11-03 (×3): 10 mg via ORAL
  Filled 2015-11-01 (×3): qty 1

## 2015-11-01 MED ORDER — OFLOXACIN 0.3 % OP SOLN
5.0000 [drp] | Freq: Two times a day (BID) | OPHTHALMIC | Status: DC
  Administered 2015-11-01 – 2015-11-05 (×9): 5 [drp] via OTIC
  Filled 2015-11-01 (×2): qty 5

## 2015-11-01 MED ORDER — INSULIN DETEMIR 100 UNIT/ML ~~LOC~~ SOLN
36.0000 [IU] | Freq: Every day | SUBCUTANEOUS | Status: DC
  Filled 2015-11-01: qty 0.36

## 2015-11-01 MED ORDER — FUROSEMIDE 10 MG/ML IJ SOLN
40.0000 mg | Freq: Every day | INTRAMUSCULAR | Status: DC
  Administered 2015-11-01: 40 mg via INTRAVENOUS
  Filled 2015-11-01: qty 4

## 2015-11-01 MED ORDER — INSULIN ASPART 100 UNIT/ML ~~LOC~~ SOLN
32.0000 [IU] | Freq: Three times a day (TID) | SUBCUTANEOUS | Status: DC
  Administered 2015-11-01 – 2015-11-05 (×12): 32 [IU] via SUBCUTANEOUS
  Filled 2015-11-01 (×12): qty 32

## 2015-11-01 MED ORDER — OXYCODONE-ACETAMINOPHEN 5-325 MG PO TABS
1.0000 | ORAL_TABLET | Freq: Four times a day (QID) | ORAL | Status: DC | PRN
  Administered 2015-11-01 – 2015-11-05 (×11): 1 via ORAL
  Filled 2015-11-01 (×11): qty 1

## 2015-11-01 MED ORDER — INSULIN DETEMIR 100 UNIT/ML ~~LOC~~ SOLN
72.0000 [IU] | Freq: Every day | SUBCUTANEOUS | Status: DC
  Administered 2015-11-01 – 2015-11-04 (×4): 72 [IU] via SUBCUTANEOUS
  Filled 2015-11-01 (×6): qty 0.72

## 2015-11-01 MED ORDER — INSULIN ASPART 100 UNIT/ML ~~LOC~~ SOLN
0.0000 [IU] | Freq: Three times a day (TID) | SUBCUTANEOUS | Status: DC
  Administered 2015-11-01: 11 [IU] via SUBCUTANEOUS
  Filled 2015-11-01: qty 11

## 2015-11-01 NOTE — Care Management (Signed)
Met with patient at bedside to discuss discharge planning. Patient lives at home with her son. She uses a walker. PCP is Ellin Goodie. Discussed home health with patient and the benefit of a nurse for her CHF. She replied, " I eat all the right things". She will talk to her daughter and decide. RNCM provided contact number. Will follow up

## 2015-11-01 NOTE — Progress Notes (Signed)
[facility] - [place] at [facility]   PATIENT NAME: [name]    MRN#:  [id number]  DATE OF BIRTH:  1928/08/10  SUBJECTIVE:  Hospital Day: 1 day [name] is a 82 y.o. female presenting with Shortness of Breath .   Overnight events: Some diarrhea Interval Events: Continued loose stools which is apparently chronic also noted elevated blood sugar no further complaints by the patient, patient's daughter has multiple complaints  REVIEW OF SYSTEMS:  CONSTITUTIONAL: No fever, fatigue or weakness.  EYES: No blurred or double vision.  EARS, NOSE, AND THROAT: No tinnitus Positive ear pain.  RESPIRATORY: No cough,Positive shortness of breath, denies wheezing or hemoptysis.  CARDIOVASCULAR: No chest pain, orthopnea,Positive edema.  GASTROINTESTINAL: No nausea, vomiting, positive diarrhea denies abdominal pain.  GENITOURINARY: No dysuria, hematuria.  ENDOCRINE: No polyuria, nocturia,  HEMATOLOGY: No anemia, easy bruising or bleeding SKIN: No rash or lesion. MUSCULOSKELETAL: No joint pain or arthritis.   NEUROLOGIC: No tingling, numbness, weakness.  PSYCHIATRY: No anxiety or depression.   DRUG ALLERGIES:   Allergies  Allergen Reactions  . Amitriptyline Nausea And Vomiting  . Influenza Vaccines Other (See Comments)    Reaction:  Unknown   . Penicillins Itching and Other (See Comments)    Has patient had a PCN reaction causing immediate rash, facial/tongue/throat swelling, SOB or lightheadedness with hypotension: No Has patient had a PCN reaction causing severe rash involving mucus membranes or skin necrosis: No Has patient had a PCN reaction that required hospitalization No Has patient had a PCN reaction occurring within the last 10 years: No If all of the above answers are "NO", then may proceed with Cephalosporin use.  . Pneumococcal Vaccines Other (See Comments)    Reaction:  Unknown   . Rosiglitazone Other (See Comments)    Reaction:  Unknown   .  Sulfa Antibiotics Itching    VITALS:  Blood pressure 175/53, pulse 78, temperature 97.9 F (36.6 C), temperature source Oral, resp. rate 24, height 4\' 11"  (1.499 m), weight 201 lb 14.4 oz (91.581 kg), SpO2 96 %.  PHYSICAL EXAMINATION:  VITAL SIGNS: Filed Vitals:   11/01/15 1345 11/01/15 1352  BP: 175/53   Pulse: 69 78  Temp:    Resp:     GENERAL:82 y.o.female currently in no acute distress.  HEAD: Normocephalic, atraumatic.  EYES: Pupils equal, round, reactive to light. Extraocular muscles intact. No scleral icterus.  MOUTH: Moist mucosal membrane. Dentition intact. No abscess noted.  EAR, NOSE, THROAT: Clear without exudates. No external lesions.  NECK: Supple. No thyromegaly. No nodules. No JVD.  PULMONARY: Diminished breath sounds without wheeze rails or rhonci. No use of accessory muscles, Good respiratory effort. good air entry bilaterally CHEST: Nontender to palpation.  CARDIOVASCULAR: S1 and S2. Regular rate and rhythm. No murmurs, rubs, or gallops. 1+ edema. Pedal pulses 2+ bilaterally.  GASTROINTESTINAL: Soft, nontender, nondistended. No masses. Positive bowel sounds. No hepatosplenomegaly.  MUSCULOSKELETAL: No swelling, clubbing, or edema. Range of motion full in all extremities.  NEUROLOGIC: Cranial nerves II through XII are intact. No gross focal neurological deficits. Sensation intact. Reflexes intact.  SKIN: No ulceration, lesions, rashes, or cyanosis. Skin warm and dry. Turgor intact.  PSYCHIATRIC: Mood, affect within normal limits. The patient is awake, alert and oriented x 3. Insight, judgment intact.      LABORATORY PANEL:   CBC  Recent Labs Lab 11/01/15 0340  WBC 10.1  HGB 11.5*  HCT 36.5  PLT 255   ------------------------------------------------------------------------------------------------------------------  Chemistries  Recent Labs Lab 11/01/15 0340  NA 139  K 5.7*  CL 111  CO2 20*  GLUCOSE 238*  BUN 78*  CREATININE 2.09*  CALCIUM  8.9   ------------------------------------------------------------------------------------------------------------------  Cardiac Enzymes  Recent Labs Lab 11/01/15 1240  TROPONINI <0.03   ------------------------------------------------------------------------------------------------------------------  RADIOLOGY:  Dg Chest 2 View  10/31/2015  CLINICAL DATA:  Shortness of breath for 2 months. EXAM: CHEST  2 VIEW COMPARISON:  07/29/2011 FINDINGS: Cardiomediastinal silhouette is normal. Mediastinal contours appear intact. There is no evidence of focal airspace consolidation, pleural effusion or pneumothorax. The lungs are low volume with exaggeration of the interstitial markings. There is probable left lower lobe atelectasis. Osseous structures are without acute abnormality. Soft tissues are grossly normal. IMPRESSION: Prominent interstitial markings, which may be secondary to low lung volumes. Alternatively these may represent a mild pulmonary vascular congestion. Left lung base atelectasis. Electronically Signed   By: Ted Mcalpine M.D.   On: 10/31/2015 20:07    EKG:   Orders placed or performed during the hospital encounter of 10/31/15  . EKG 12-Lead  . EKG 12-Lead  . ED EKG  . ED EKG    ASSESSMENT AND PLAN:   [name] is a 82 y.o. female presenting with Shortness of Breath . Admitted 10/31/2015 : Day #: 1 day 1. Acute kidney injury on chronic kidney disease: Continue careful diuresis consult nephrology 2. Diastolic congestive heart failure chronic: Continue diuresis cardiology input appreciated 3. Hyperkalemia: Hold lisinopril and follow potassium level IV. Type 2 diabetes poorly controlled: Appreciate that diabetic management recommendations restart basal insulin pre-meal and post meal coverage. 5. Chronic TMJ?: ENT input appreciated   All the records are reviewed and case discussed with Care Management/Social Workerr. Management plans discussed with the patient, family  and they are in agreement.  CODE STATUS: full TOTAL TIME TAKING CARE OF THIS PATIENT: 33 minutes.   POSSIBLE D/C IN 1-2DAYS, DEPENDING ON CLINICAL CONDITION.   Hower,  Mardi Mainland.D on 11/01/2015 at 2:03 PM  Between 7am to 6pm - Pager - [phone]  After 6pm: House Pager: - [phone]  [facility]  Office  [phone]  CC: Primary care physician; Hyman Hopes, MD

## 2015-11-01 NOTE — Consult Note (Signed)
[place], [place] [id number] 08/27/1928 Riley Nearing, MD  Reason for Consult: Left ear pain  Requesting Physician: Lytle Butte, MD   Consulting Physician: Riley Nearing, MD  HPI: This 82 y.o. year old female was admitted on 10/31/2015 for Shortness of breath [R06.02]. She has a long history of issues with ear pain, and has been followed by my partner Dr. Carloyn Manner. 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 She has not seen him in several months however. Looking at prior notes, some of the ear pain appears to have been attributed to temporomandibular joint issues. She denies any recent colds or drainage from the ear.  Medications:  Current Facility-Administered Medications  Medication Dose Route Frequency Provider Last Rate Last Dose  . acetaminophen (TYLENOL) tablet 650 mg  650 mg Oral Q6H PRN Lance Coon, MD       Or  . acetaminophen (TYLENOL) suppository 650 mg  650 mg Rectal Q6H PRN Lance Coon, MD      . albuterol (PROVENTIL) (2.5 MG/3ML) 0.083% nebulizer solution 3 mL  3 mL Inhalation Q4H PRN Lance Coon, MD   3 mL at 11/01/15 0302  . furosemide (LASIX) injection 40 mg  40 mg Intravenous Daily Lytle Butte, MD   40 mg at 11/01/15 0831  . gabapentin (NEURONTIN) capsule 300 mg  300 mg Oral BID Lance Coon, MD   300 mg at 11/01/15 0831  . heparin injection 5,000 Units  5,000 Units Subcutaneous Q8H Lance Coon, MD   5,000 Units at 11/01/15 0604  . hydrALAZINE (APRESOLINE) injection 10 mg  10 mg Intravenous Q4H PRN Lytle Butte, MD      . insulin aspart (novoLOG) injection 0-5 Units  0-5 Units Subcutaneous QHS Lance Coon, MD   0 Units at 11/01/15 0037  . insulin aspart (novoLOG) injection 0-9 Units  0-9 Units Subcutaneous TID WC Lance Coon, MD   3 Units at 11/01/15 0830  . labetalol (NORMODYNE,TRANDATE) injection 10 mg  10 mg Intravenous Q2H PRN Lance Coon, MD       . metoprolol (LOPRESSOR) tablet 50 mg  50 mg Oral BID Lance Coon, MD   50 mg at 11/01/15 0036  . ondansetron (ZOFRAN) tablet 4 mg  4 mg Oral Q6H PRN Lance Coon, MD       Or  . ondansetron [facility]) injection 4 mg  4 mg Intravenous Q6H PRN Lance Coon, MD      . oxyCODONE-acetaminophen (PERCOCET/ROXICET) 5-325 MG per tablet 1 tablet  1 tablet Oral Q6H PRN Lance Coon, MD      . sodium chloride flush (NS) 0.9 % injection 3 mL  3 mL Intravenous Q12H Lance Coon, MD   3 mL at 11/01/15 0831  .  Medications Prior to Admission  Medication Sig Dispense Refill  . albuterol (PROVENTIL HFA;VENTOLIN HFA) 108 (90 Base) MCG/ACT inhaler Inhale 2 puffs into the lungs every 4 (four) hours as needed for wheezing or shortness of breath.    Marland Kitchen atorvastatin (LIPITOR) 20 MG tablet Take 20 mg by mouth at bedtime.    . diphenoxylate-atropine (LOMOTIL) 2.5-0.025 MG tablet Take 1-2 tablets by mouth 4 (four) times daily as needed for diarrhea or loose stools.    . furosemide (LASIX) 40 MG tablet Take 40 mg by mouth daily.    Marland Kitchen gabapentin (NEURONTIN) 300 MG capsule Take 300 mg by mouth 3 (three)  times daily.    . insulin aspart (NOVOLOG) 100 UNIT/ML injection Inject 32 Units into the skin 3 (three) times daily before meals.     . insulin detemir (LEVEMIR) 100 UNIT/ML injection Inject 72 Units into the skin at bedtime.    Marland Kitchen lisinopril (PRINIVIL,ZESTRIL) 40 MG tablet Take 40 mg by mouth 2 (two) times daily.    . metoprolol (LOPRESSOR) 50 MG tablet Take 50 mg by mouth 2 (two) times daily.    Marland Kitchen oxyCODONE-acetaminophen (PERCOCET/ROXICET) 5-325 MG tablet Take 1 tablet by mouth every 6 (six) hours as needed for severe pain.       Allergies:  Allergies  Allergen Reactions  . Amitriptyline Nausea And Vomiting  . Influenza Vaccines Other (See Comments)    Reaction:  Unknown   . Penicillins Itching and Other (See Comments)    Has patient had a PCN reaction causing immediate rash, facial/tongue/throat swelling, SOB or  lightheadedness with hypotension: No Has patient had a PCN reaction causing severe rash involving mucus membranes or skin necrosis: No Has patient had a PCN reaction that required hospitalization No Has patient had a PCN reaction occurring within the last 10 years: No If all of the above answers are "NO", then may proceed with Cephalosporin use.  . Pneumococcal Vaccines Other (See Comments)    Reaction:  Unknown   . Rosiglitazone Other (See Comments)    Reaction:  Unknown   . Sulfa Antibiotics Itching    PMH:  Past Medical History  Diagnosis Date  . Diabetes mellitus, insulin dependent (IDDM), uncontrolled ([place])   . CKD (chronic kidney disease), stage III   . HLD (hyperlipidemia)   . Essential hypertension   . Gout   . Morbid obesity ([place])   . Chronic diastolic CHF (congestive heart failure) ([place])   . Osteoarthritis   . Asthma     Fam Hx:  Family History  Problem Relation Age of Onset  . Melanoma Mother     Soc Hx:  Social History   Social History  . Marital Status: Unknown    Spouse Name: N/A  . Number of Children: N/A  . Years of Education: N/A   Occupational History  . Not on file.   Social History Main Topics  . Smoking status: Never Smoker   . Smokeless tobacco: Not on file  . Alcohol Use: No  . Drug Use: No  . Sexual Activity: Not on file   Other Topics Concern  . Not on file   Social History Narrative    PSH:  Past Surgical History  Procedure Laterality Date  . Ovarian cyst removal    . Procedures since admission: No admission procedures for hospital encounter.  ROS: Review of systems normal other than 12 systems except per HPI.  PHYSICAL EXAM  Vitals: Blood pressure 137/39, pulse 70, temperature 98.4 F (36.9 C), temperature source Oral, resp. rate 24, height 4' 11"  (1.499 m), weight 91.581 kg (201 lb 14.4 oz), SpO2 95 %.. General: Well-developed, Obese, Well-nourished in no acute distress Mood: Mood and affect well adjusted, pleasant and  cooperative. Orientation: Grossly alert and oriented. Vocal Quality: No hoarseness. Communicates verbally. head and Face: NCAT. No facial asymmetry. No visible skin lesions. No significant facial scars. No tenderness with sinus percussion. Facial strength normal and symmetric. She does have some tenderness over the left temporomandibular joint region with movement but no palpable swelling or mass. Ears: External ears with normal landmarks, no lesions. External auditory canals revealed some moist cerumen in the  left ear but I can visualize the tympanic membrane. There may be some minor canal irritation. A small amount of cerumen is noted in the right canal. Tympanic membranes intact with good landmarks and normal mobility on pneumatic otoscopy. No middle ear effusion. Hearing: Speech reception grossly normal. Nose: External nose normal with midline dorsum and no lesions or deformity. Nasal Cavity reveals essentially midline septum with normal inferior turbinates. No significant mucosal congestion or erythema. Nasal secretions are minimal and clear. No polyps seen on anterior rhinoscopy. Oral Cavity/ Oropharynx: Lips are normal with no lesions. She is edentulous, wearing dentures. Initially she refused oral examination, and it became clear this was because she had tobacco in her mouth. Exam was somewhat limited by the amount of tobacco staining in the oral cavity as well as her gag reflex but no obvious lesions were seen. The was no sign of infection.  Indirect Laryngoscopy/Nasopharyngoscopy: Visualization of the larynx, hypopharynx and nasopharynx is not possible in this setting with routine examination. Neck: Supple and symmetric with no palpable masses, tenderness or crepitance. The trachea is midline. Thyroid gland is soft, nontender and symmetric with no masses or enlargement. Parotid and submandibular glands are soft, nontender and symmetric, without masses. Lymphatic: Cervical lymph nodes are without  palpable lymphadenopathy or tenderness. Respiratory: Normal respiratory effort without labored breathing. Cardiovascular: Carotid pulse shows regular rate and rhythm Neurologic: Cranial Nerves II through XII are grossly intact. Eyes: Gaze and Ocular Motility are grossly normal. PERRLA. No visible nystagmus.  MEDICAL DECISION MAKING: Data Review:  Results for orders placed or performed during the hospital encounter of 10/31/15 (from the past 48 hour(s))  CBC     Status: Abnormal   Collection Time: 10/31/15  7:35 PM  Result Value Ref Range   WBC 8.0 3.6 - 11.0 K/uL   RBC 4.06 3.80 - 5.20 MIL/uL   Hemoglobin 10.9 (L) 12.0 - 16.0 g/dL   HCT 34.4 (L) 35.0 - 47.0 %   MCV 84.6 80.0 - 100.0 fL   MCH 26.7 26.0 - 34.0 pg   MCHC 31.6 (L) 32.0 - 36.0 g/dL   RDW 18.6 (H) 11.5 - 14.5 %   Platelets 225 150 - 440 K/uL  Basic metabolic panel     Status: Abnormal   Collection Time: 10/31/15  7:35 PM  Result Value Ref Range   Sodium 141 135 - 145 mmol/L   Potassium 5.7 (H) 3.5 - 5.1 mmol/L   Chloride 110 101 - 111 mmol/L   CO2 24 22 - 32 mmol/L   Glucose, Bld 133 (H) 65 - 99 mg/dL   BUN 82 (H) 6 - 20 mg/dL   Creatinine, Ser 2.18 (H) 0.44 - 1.00 mg/dL   Calcium 8.7 (L) 8.9 - 10.3 mg/dL   GFR calc non Af Amer 20 (L) >60 mL/min   GFR calc Af Amer 23 (L) >60 mL/min    Comment: (NOTE) The eGFR has been calculated using the CKD EPI equation. This calculation has not been validated in all clinical situations. eGFR's persistently <60 mL/min signify possible Chronic Kidney Disease.    Anion gap 7 5 - 15  Troponin I     Status: None   Collection Time: 10/31/15  7:35 PM  Result Value Ref Range   Troponin I 0.03 <0.031 ng/mL    Comment:        NO INDICATION OF MYOCARDIAL INJURY.   Brain natriuretic peptide     Status: Abnormal   Collection Time: 10/31/15  7:35 PM  Result Value Ref Range   B Natriuretic Peptide 183.0 (H) 0.0 - 100.0 pg/mL  Glucose, capillary     Status: Abnormal   Collection  Time: 10/31/15 11:54 PM  Result Value Ref Range   Glucose-Capillary 124 (H) 65 - 99 mg/dL  Basic metabolic panel     Status: Abnormal   Collection Time: 11/01/15  3:40 AM  Result Value Ref Range   Sodium 139 135 - 145 mmol/L   Potassium 5.7 (H) 3.5 - 5.1 mmol/L   Chloride 111 101 - 111 mmol/L   CO2 20 (L) 22 - 32 mmol/L   Glucose, Bld 238 (H) 65 - 99 mg/dL   BUN 78 (H) 6 - 20 mg/dL   Creatinine, Ser 2.09 (H) 0.44 - 1.00 mg/dL   Calcium 8.9 8.9 - 10.3 mg/dL   GFR calc non Af Amer 21 (L) >60 mL/min   GFR calc Af Amer 24 (L) >60 mL/min    Comment: (NOTE) The eGFR has been calculated using the CKD EPI equation. This calculation has not been validated in all clinical situations. eGFR's persistently <60 mL/min signify possible Chronic Kidney Disease.    Anion gap 8 5 - 15  CBC     Status: Abnormal   Collection Time: 11/01/15  3:40 AM  Result Value Ref Range   WBC 10.1 3.6 - 11.0 K/uL   RBC 4.42 3.80 - 5.20 MIL/uL   Hemoglobin 11.5 (L) 12.0 - 16.0 g/dL   HCT 36.5 35.0 - 47.0 %   MCV 82.7 80.0 - 100.0 fL   MCH 26.1 26.0 - 34.0 pg   MCHC 31.6 (L) 32.0 - 36.0 g/dL   RDW 18.5 (H) 11.5 - 14.5 %   Platelets 255 150 - 440 K/uL  Glucose, capillary     Status: Abnormal   Collection Time: 11/01/15  8:05 AM  Result Value Ref Range   Glucose-Capillary 208 (H) 65 - 99 mg/dL  . Dg Chest 2 View  10/31/2015  CLINICAL DATA:  Shortness of breath for 2 months. EXAM: CHEST  2 VIEW COMPARISON:  07/29/2011 FINDINGS: Cardiomediastinal silhouette is normal. Mediastinal contours appear intact. There is no evidence of focal airspace consolidation, pleural effusion or pneumothorax. The lungs are low volume with exaggeration of the interstitial markings. There is probable left lower lobe atelectasis. Osseous structures are without acute abnormality. Soft tissues are grossly normal. IMPRESSION: Prominent interstitial markings, which may be secondary to low lung volumes. Alternatively these may represent a mild  pulmonary vascular congestion. Left lung base atelectasis. Electronically Signed   By: Fidela Salisbury M.D.   On: 10/31/2015 20:07  .  ASSESSMENT: Chronic left otalgia. More than likely there is chronic temporomandibular joint pain causing this, however the canal looks mildly irritated currently so there could be some mild component of otitis externa currently. There are no skin lesions to suggest recurrence of the chondritis.  PLAN: I will put her on some Floxin drops and have her follow-up as an outpatient with Dr. Pryor Ochoa, who is quite familiar with her ongoing issues. I also recommended she discontinue smokeless tobacco use.    Riley Nearing, MD 11/01/2015 10:06 AM

## 2015-11-01 NOTE — Evaluation (Signed)
Physical Therapy Evaluation Patient Details Name: [name] MRN: [id number] DOB: Oct 24, 1928 Today's Date: 11/01/2015   History of Present Illness  82 yo F presented to ED for SOB x2 months and  L anterior ear pain for 6 months after an infected lymphnode. She was found to have acute on chronic diastolic CHF. PMH includes DM, CKD, HLD, HTN, and gout.   Clinical Impression  Pt demonstrated generalized weakness and difficulty walking, limited by decreased endurance. She requires min guard for transfers and ambulation with FWW up to 70 ft. SpO2 remained 94 to 96% on RA with some noted SOB present. Pt demonstrated impulsiveness during mobility. HHPT recommended to address deficits of strength, endurance, balance and gait to progress towards PLOF and reduce caregiver burden of care. Pt will benefit from skilled PT services to increase functional I and mobility for safe discharge.     Follow Up Recommendations Home health PT;Supervision/Assistance - 24 hour    Equipment Recommendations  None recommended by PT;Other (comment) (pt has recommended FWW)    Recommendations for Other Services       Precautions / Restrictions Precautions Precautions: Fall Restrictions Weight Bearing Restrictions: No      Mobility  Bed Mobility               General bed mobility comments: up in recliner, NT  Transfers Overall transfer level: Needs assistance Equipment used: Rolling walker (2 wheeled) Transfers: Sit to/from [employer] Sit to Stand: Min guard Stand pivot transfers: Min guard       General transfer comment: Cues for hand placement on chair, tends to pull up on walker  Ambulation/Gait Ambulation/Gait assistance: Min guard Ambulation Distance (Feet): 75 Feet Assistive device: Rolling walker (2 wheeled) Gait Pattern/deviations: Decreased stride length;Trunk flexed Gait velocity: reduced   General Gait Details: Impulsive but steady gait with no LOB. Tends to push  FWW very far ahead of her and requires frequent cues for staying close to [facility].  Stairs            Wheelchair Mobility    Modified Rankin (Stroke Patients Only)       Balance Overall balance assessment: Needs assistance Sitting-balance support: No upper extremity supported Sitting balance-Leahy Scale: Good     Standing balance support: Bilateral upper extremity supported Standing balance-Leahy Scale: Good                               Pertinent Vitals/Pain Pain Assessment: No/denies pain    Home Living Family/patient expects to be discharged to:: Private residence Living Arrangements: Children (son) Available Help at Discharge: Family;Available PRN/intermittently Type of Home: Mobile home Home Access: Ramped entrance     Home Layout: One level Home Equipment: Walker - 2 wheels      Prior Function Level of Independence: Needs assistance   Gait / Transfers Assistance Needed: Household ambulation with FWW, assistance from family for getting in/out of car, transportation with wheelchair for long distances  ADL's / Homemaking Assistance Needed: assistance from family        Hand Dominance        Extremity/Trunk Assessment   Upper Extremity Assessment: Generalized weakness           Lower Extremity Assessment: Generalized weakness (grossly 4-/5)      Cervical / Trunk Assessment: Kyphotic  Communication   Communication: HOH  Cognition Arousal/Alertness: Awake/alert Behavior During Therapy: Impulsive Overall Cognitive Status: Within Functional Limits for tasks  assessed                      General Comments General comments (skin integrity, edema, etc.): B LE pitting edema    Exercises Other Exercises Other Exercises: Discussed importance of exercise and daily ambulation to improve endurance. Educated pt on performing ankle pumps and elevating LEs during the day to facilitate reduction of LE edema. Pt and daughter verbalized  understanding.      Assessment/Plan    PT Assessment Patient needs continued PT services  PT Diagnosis Difficulty walking;Generalized weakness   PT Problem List Decreased strength;Decreased activity tolerance;Decreased balance;Decreased mobility;Decreased safety awareness;Cardiopulmonary status limiting activity;Obesity  PT Treatment Interventions DME instruction;Gait training;Therapeutic activities;Therapeutic exercise;Balance training;Neuromuscular re-education;Patient/family education   PT Goals (Current goals can be found in the Care Plan section) Acute Rehab PT Goals Patient Stated Goal: to go home PT Goal Formulation: With patient/family Time For Goal Achievement: 11/15/15 Potential to Achieve Goals: Fair    Frequency Min 2X/week   Barriers to discharge Decreased caregiver support pt is alone occasionally during the day    Co-evaluation               End of Session Equipment Utilized During Treatment: Gait belt Activity Tolerance: Patient tolerated treatment well Patient left: in chair;with call bell/phone within reach;with chair alarm set;with family/visitor present Nurse Communication: Mobility status         Time: 2595-6387 PT Time Calculation (min) (ACUTE ONLY): 24 min   Charges:   PT Evaluation $PT Eval Moderate Complexity: 1 Procedure PT Treatments $Therapeutic Activity: 8-22 mins   PT G Codes:        Adelene Idler, PT, DPT  11/01/2015, 2:13 PM [phone]

## 2015-11-01 NOTE — Care Management Important Message (Signed)
Important Message  Patient Details  Name: [name] MRN: [id number] Date of Birth: December 19, 1928   Medicare Important Message Given:  Yes    Marily Memos, RN 11/01/2015, 2:31 PM

## 2015-11-01 NOTE — Progress Notes (Signed)
The patient's daughter has a multitude of concerns. One of these being diabetic management stating she cannot understand why her mother is being started on lower doses of insulin  Multiple attempts made to explain a built-in safety protocol starting slightly lower given patient's illness, decreased appetite, new environment-and increasing as tolerated The patient fell this was a satisfactory answer and seemed perfectly fine with the treatment plan The patient's daughter remains frustrated for some reason, followed by various demands such as speaking with the cardiologist, and ENT directly, actually seeing the chest xray and echocardiogram.  All questions were answered to the best of all abilities.

## 2015-11-01 NOTE — Progress Notes (Signed)
Patient BP improved after IV hydralazine, first dose of norvasc given.  Trudee Kuster

## 2015-11-01 NOTE — Progress Notes (Signed)
Inpatient Diabetes Program Recommendations  AACE/ADA: New Consensus Statement on Inpatient Glycemic Control (2015)  Target Ranges:  Prepandial:   less than 140 mg/dL      Peak postprandial:   less than 180 mg/dL (1-2 hours)      Critically ill patients:  140 - 180 mg/dL   Lab Results  Component Value Date   GLUCAP 319* 11/01/2015    Review of Glycemic Control:  Results for JAIONA, SIMIEN (MRN [id number]) as of 11/01/2015 11:06  Ref. Range 10/31/2015 23:54 11/01/2015 08:05 11/01/2015 10:59  Glucose-Capillary Latest Ref Range: 65-99 mg/dL 174 (H) 081 (H) 448 (H)   Diabetes history: Type 2 diabetes Outpatient Diabetes medications: Levemir 72 units q HS, Novolog 32 units tid with meals  Current orders for Inpatient glycemic control:  Novolog sensitive tid with meals and HS  Inpatient Diabetes Program Recommendations:   Please consider restarting 1/2 of patients home insulin.  Consider Levemir 36 units q HS and Novolog 10 units tid with meals.  Also consider increasing Novolog correction to moderate tid with meals.  Thanks, Beryl Meager, RN, BC-ADM Inpatient Diabetes Coordinator Pager [phone] (8a-5p)

## 2015-11-01 NOTE — Progress Notes (Signed)
Informed by nursing staff patient remains hypertensive Currently off lisinopril given AKI and hyperK Continue PRN hydralazine Can start norvasc - resume lisinopril if/when renal function improves

## 2015-11-01 NOTE — Progress Notes (Addendum)
Afternoon vitals showed BP to be elevated. Dr. Clint Guy notified and metoprolol given that was held this am d/t low diastolic. Patient also complaining of jaw pain, pain medication given. Daughter is upset about insulin dosage, Dr. Clint Guy and myself explained reason for dosing, and diabetes coordinater paged to further explain insulin. Per diabetes coordinator, Campbell Lerner, patient should be fine to start back on home dosage of insulin. Dr. Clint Guy notified and orders placed, patient and family notified. BP has decreased, and will continue to monitor. Trudee Kuster

## 2015-11-01 NOTE — Progress Notes (Signed)
*  PRELIMINARY RESULTS* Echocardiogram 2D Echocardiogram has been performed.  [name] Hege 11/01/2015, 7:50 AM

## 2015-11-01 NOTE — Consult Note (Signed)
Cardiology Consultation Note  Patient ID: [name], MRN: [id number], DOB/AGE: [birth year] 82 y.o. Admit date: 10/31/2015   Date of Consult: 11/01/2015 Primary Physician: Hyman Hopes, MD Primary Cardiologist: New to [facility] Requesting Physician: Dr. Anne Hahn, MD  Chief Complaint: SOB x 2 months Reason for Consult: Acute on chronic diastolic CHF  HPI: 82 y.o. female with h/o poorly controlled HTN, CKD stage III to IV, poorly controlled asthma since childhood, HLD, uncontrolled IDDM with peripheral neuropathy, osteoarthritis, and ambulates with a walker who presented to [facility] on 6/6 with 2 year history of SOB and left-sided otalgia. Cardiology is consulted for acute on chronic diastolic CHF.   No previously known cardiac history. Patient's main complaint this morning is her left ear, stating the pain is getting worse. She has been experiencing SOB for at least the past 2 years. Her symptoms usually improve with her albuterol inhaler and they still did as of late, just not as well prompting her to come in along with the left-sided otalgia. She has not been dealing with any increased weight gain, orthopnea, or early satiety. She does eat a diet of high salt intake. She is not certain what her blood pressure runs at home. Never with angina.   Upon the patient's arrival to [facility] they were found to have minimally elevated BNP of 183, troponin negative x 1, SCr 2.18-->2.09, K+ 5.7 x 2, WBC 8.0-->10.0, hgb 10.9, . ECG showed NSR, 71 bpm, TWI I, II, aVL, V5-V6, CXR showed low lung volumes with question of mild vascular congestion. SBP in the 180's to 199 systolic upon admission. SBP currently 137 mm Hg. She was started on IV Lasix 40 mg daily (this is her home dose) and has received 2 doses to date with a UOP of 300 mL.    Past Medical History  Diagnosis Date  . Diabetes mellitus, insulin dependent (IDDM), uncontrolled (HCC)   . CKD (chronic kidney disease), stage III   . HLD (hyperlipidemia)   .  Essential hypertension   . Gout   . Morbid obesity (HCC)   . Chronic diastolic CHF (congestive heart failure) (HCC)   . Osteoarthritis   . Asthma       Most Recent Cardiac Studies: Echo 11/01/15: Study Conclusions  - Procedure narrative: Transthoracic echocardiography. The study  was technically difficult. - Left ventricle: The cavity size was normal. Wall thickness was  increased in a pattern of mild to moderate LVH. Systolic function  was normal. The estimated ejection fraction was in the range of  55% to 60%. Doppler parameters are consistent with abnormal left  ventricular relaxation (grade 1 diastolic dysfunction). Doppler  parameters are consistent with elevated mean left atrial filling  pressure.   Surgical History:  Past Surgical History  Procedure Laterality Date  . Ovarian cyst removal       Home Meds: Prior to Admission medications   Medication Sig Start Date End Date Taking? Authorizing Provider  albuterol (PROVENTIL HFA;VENTOLIN HFA) 108 (90 Base) MCG/ACT inhaler Inhale 2 puffs into the lungs every 4 (four) hours as needed for wheezing or shortness of breath.   Yes Historical Provider, MD  atorvastatin (LIPITOR) 20 MG tablet Take 20 mg by mouth at bedtime.   Yes Historical Provider, MD  diphenoxylate-atropine (LOMOTIL) 2.5-0.025 MG tablet Take 1-2 tablets by mouth 4 (four) times daily as needed for diarrhea or loose stools.   Yes Historical Provider, MD  furosemide (LASIX) 40 MG tablet Take 40 mg by mouth daily.  Yes Historical Provider, MD  gabapentin (NEURONTIN) 300 MG capsule Take 300 mg by mouth 3 (three) times daily.   Yes Historical Provider, MD  insulin aspart (NOVOLOG) 100 UNIT/ML injection Inject 32 Units into the skin 3 (three) times daily before meals.    Yes Historical Provider, MD  insulin detemir (LEVEMIR) 100 UNIT/ML injection Inject 72 Units into the skin at bedtime.   Yes Historical Provider, MD  lisinopril (PRINIVIL,ZESTRIL) 40 MG tablet  Take 40 mg by mouth 2 (two) times daily.   Yes Historical Provider, MD  metoprolol (LOPRESSOR) 50 MG tablet Take 50 mg by mouth 2 (two) times daily.   Yes Historical Provider, MD  oxyCODONE-acetaminophen (PERCOCET/ROXICET) 5-325 MG tablet Take 1 tablet by mouth every 6 (six) hours as needed for severe pain.    Yes Historical Provider, MD    Inpatient Medications:  . furosemide  40 mg Intravenous Daily  . gabapentin  300 mg Oral BID  . heparin  5,000 Units Subcutaneous Q8H  . insulin aspart  0-5 Units Subcutaneous QHS  . insulin aspart  0-9 Units Subcutaneous TID WC  . metoprolol  50 mg Oral BID  . sodium chloride flush  3 mL Intravenous Q12H      Allergies:  Allergies  Allergen Reactions  . Amitriptyline Nausea And Vomiting  . Influenza Vaccines Other (See Comments)    Reaction:  Unknown   . Penicillins Itching and Other (See Comments)    Has patient had a PCN reaction causing immediate rash, facial/tongue/throat swelling, SOB or lightheadedness with hypotension: No Has patient had a PCN reaction causing severe rash involving mucus membranes or skin necrosis: No Has patient had a PCN reaction that required hospitalization No Has patient had a PCN reaction occurring within the last 10 years: No If all of the above answers are "NO", then may proceed with Cephalosporin use.  . Pneumococcal Vaccines Other (See Comments)    Reaction:  Unknown   . Rosiglitazone Other (See Comments)    Reaction:  Unknown   . Sulfa Antibiotics Itching    Social History   Social History  . Marital Status: Unknown    Spouse Name: N/A  . Number of Children: N/A  . Years of Education: N/A   Occupational History  . Not on file.   Social History Main Topics  . Smoking status: Never Smoker   . Smokeless tobacco: Not on file  . Alcohol Use: No  . Drug Use: No  . Sexual Activity: Not on file   Other Topics Concern  . Not on file   Social History Narrative     Family History  Problem  Relation Age of Onset  . Melanoma Mother      Review of Systems: Review of Systems  Constitutional: Positive for malaise/fatigue. Negative for fever, chills, weight loss and diaphoresis.  HENT: Positive for ear pain. Negative for congestion.        Left-sided otalgia   Eyes: Negative for discharge and redness.  Respiratory: Positive for shortness of breath. Negative for cough, hemoptysis, sputum production and wheezing.   Cardiovascular: Positive for leg swelling. Negative for chest pain, palpitations, orthopnea, claudication and PND.  Gastrointestinal: Negative for nausea, vomiting and abdominal pain.  Musculoskeletal: Negative for myalgias and falls.  Skin: Negative for rash.  Neurological: Positive for weakness. Negative for dizziness, sensory change, speech change, focal weakness and loss of consciousness.  Endo/Heme/Allergies: Does not bruise/bleed easily.  Psychiatric/Behavioral: Negative for substance abuse. The patient is not nervous/anxious.  All other systems reviewed and are negative.   Labs:  Recent Labs  10/31/15 1935  TROPONINI 0.03   Lab Results  Component Value Date   WBC 10.1 11/01/2015   HGB 11.5* 11/01/2015   HCT 36.5 11/01/2015   MCV 82.7 11/01/2015   PLT 255 11/01/2015     Recent Labs Lab 11/01/15 0340  NA 139  K 5.7*  CL 111  CO2 20*  BUN 78*  CREATININE 2.09*  CALCIUM 8.9  GLUCOSE 238*    Radiology/Studies:  Dg Chest 2 View  10/31/2015  IMPRESSION: Prominent interstitial markings, which may be secondary to low lung volumes. Alternatively these may represent a mild pulmonary vascular congestion. Left lung base atelectasis. Electronically Signed   By: Ted Mcalpine M.D.   On: 10/31/2015 20:07    EKG: Interpreted by me showed: NSR, 71 bpm, TWI I, II, aVL, V5-V6 Telemetry: Interpreted by me showed: NSR, 80's bpm  Weights: Filed Weights   10/31/15 1930 10/31/15 2347 11/01/15 0523  Weight: 201 lb (91.173 kg) 200 lb 1.6 oz (90.765 kg)  201 lb 14.4 oz (91.581 kg)     Physical Exam: Blood pressure 137/39, pulse 70, temperature 98.4 F (36.9 C), temperature source Oral, resp. rate 24, height 4\' 11"  (1.499 m), weight 201 lb 14.4 oz (91.581 kg), SpO2 95 %. Body mass index is 40.76 kg/(m^2). General: Well developed, well nourished, in no acute distress. Head: Normocephalic, atraumatic, sclera non-icteric, no xanthomas, nares are without discharge.  Neck: Negative for carotid bruits. JVD not elevated. Lungs: Clear bilaterally to auscultation without wheezes, rales, or rhonchi. Breathing is unlabored. Heart: RRR with S1 S2. No murmurs, rubs, or gallops appreciated. Abdomen: Obese, soft, non-tender, non-distended with normoactive bowel sounds. No hepatomegaly. No rebound/guarding. No obvious abdominal masses. Msk:  Strength and tone appear normal for age. Extremities: No clubbing or cyanosis. No edema. Distal pedal pulses are 2+ and equal bilaterally. Neuro: Alert and oriented X 3. No facial asymmetry. No focal deficit. Moves all extremities spontaneously. Psych:  Responds to questions appropriately with a normal affect.    Assessment and Plan:  Principal Problem:   Shortness of breath Active Problems:   Diabetes (HCC)   Chronic diastolic CHF (congestive heart failure) (HCC)   Accelerated hypertension   CKD (chronic kidney disease), stage III   Asthma   Lower extremity edema   Ear pain   Morbid obesity (HCC)    1. SOB/acute on chronic grade 1 diastolic dysfunction: -Echo as above with normal EF, mild to moderate LVH, GR1DD -BNP only minimally elevated at 183, though may be somewhat falsely depressed given her body habitus  -has received Lasix 40 mg daily x 2 since her admission with minimal UOP. Lasix 40 mg daily is her home dose -If plans for inpatient diuresis would need to increase above her home dose pending renal function  -Suspect her symptoms, which have been going on for 2 years are likely to be multifactorial  including poorly controlled ashtma, morbid obesity, deconditioning, anemia of chronic disease, and possibly mild component of grade 1 diastolic dysfunction  2. Accelerated hypertension: -Improved -On Lasix 40 mg daily, lisinopril 40 mg daily, and Lopressor 50 mg bid at home -Continue to monitor -Per IM  3. Anemia of chronic disease: -Stable -Per IM  4. Acute on CKD stage III: -Consider renal consultation  -Limit nephrotoxic agents -For now, per IM  5. Morbid obesity/deconditioning: -Likely playing a role in #1 -Weight loss advised  6. LE edema: -Has been present  long term, reports worsening in the past several months -Previously told 2/2 renal disease-->renal disease has been worsening -Chronic issue -Does not wear compression hose and does not elevated legs when sitting, advised her to do so -Needs compression hose  7. HLD: -Continue Lipitor 20 mg daily  8. Uncontrolled IDDM with peripheral neuropathy: -A1C pending at this time -Per IM  9. Poorly controlled asthma: -Likely playing a role in #1 as well -Nebs per IM  10. Left otalgia: -This is her main complaint and the reason she presented to the hospital -Per IM  11. Abnormal EKG: -Troponin negative x 1, added further to cycle out to 3 total -If troponin remains negative can plan for outpatient nuclear stress test    Signed, Eula Listen, PA-C [facility] Pager: [phone] 11/01/2015, 9:54 AM

## 2015-11-01 NOTE — Progress Notes (Signed)
Remains dyspneic with minimal exertion.  Oxygen saturations>90% on room air.

## 2015-11-01 NOTE — Progress Notes (Signed)
Patient BP still elevated, Dr. Clint Guy notified and ordered to give PRN IV hydralazine now and adjustments would be made to daily medications.  [name]

## 2015-11-01 NOTE — Progress Notes (Signed)
Per Dr. Clint Guy, hold metoprolol d/t diastolic. Trudee Kuster

## 2015-11-02 LAB — BASIC METABOLIC PANEL
Anion gap: 9 (ref 5–15)
BUN: 80 mg/dL — AB (ref 6–20)
CHLORIDE: 108 mmol/L (ref 101–111)
CO2: 21 mmol/L — AB (ref 22–32)
CREATININE: 2.28 mg/dL — AB (ref 0.44–1.00)
Calcium: 8.9 mg/dL (ref 8.9–10.3)
GFR calc Af Amer: 22 mL/min — ABNORMAL LOW (ref >60)
GFR calc non Af Amer: 19 mL/min — ABNORMAL LOW (ref >60)
GLUCOSE: 195 mg/dL — AB (ref 65–99)
Potassium: 5 mmol/L (ref 3.5–5.1)
Sodium: 138 mmol/L (ref 135–145)

## 2015-11-02 LAB — GLUCOSE, CAPILLARY
GLUCOSE-CAPILLARY: 164 mg/dL — AB (ref 65–99)
GLUCOSE-CAPILLARY: 75 mg/dL (ref 65–99)
Glucose-Capillary: 158 mg/dL — ABNORMAL HIGH (ref 65–99)
Glucose-Capillary: 174 mg/dL — ABNORMAL HIGH (ref 65–99)
Glucose-Capillary: 182 mg/dL — ABNORMAL HIGH (ref 65–99)
Glucose-Capillary: 188 mg/dL — ABNORMAL HIGH (ref 65–99)
Glucose-Capillary: 203 mg/dL — ABNORMAL HIGH (ref 65–99)
Glucose-Capillary: 97 mg/dL (ref 65–99)

## 2015-11-02 MED ORDER — FUROSEMIDE 40 MG PO TABS: 20.0000 mg | ORAL_TABLET | Freq: Every day | ORAL | Status: DC

## 2015-11-02 MED ORDER — AMLODIPINE BESYLATE 10 MG PO TABS: 10.0000 mg | ORAL_TABLET | Freq: Every day | ORAL | Status: DC

## 2015-11-02 MED ORDER — LOPERAMIDE HCL 2 MG PO CAPS: 2.0000 mg | ORAL_CAPSULE | ORAL | Status: AC | PRN

## 2015-11-02 MED ORDER — OFLOXACIN 0.3 % OP SOLN: 5.0000 [drp] | Freq: Two times a day (BID) | OPHTHALMIC | Status: AC

## 2015-11-02 NOTE — Progress Notes (Signed)
   Held Lasix this morning given worsening renal function. Her symptoms appear to be multifactorial as noted in consult note. WOuld treat underlying medical issues. BP improved. No other active cardiac changes to be made today.

## 2015-11-02 NOTE — Progress Notes (Signed)
[facility] [place] at [facility]   PATIENT NAME: [name]    MRN#:  [id number]  DATE OF BIRTH:  1929-03-09  SUBJECTIVE:  Hospital Day: 2 days [name] is a 82 y.o. female presenting with Shortness of Breath .   Overnight events: No overnight events Interval Events: No complaints patient states wants to go home  REVIEW OF SYSTEMS:  CONSTITUTIONAL: No fever, fatigue or weakness.  EYES: No blurred or double vision.  EARS, NOSE, AND THROAT: No tinnitus Positive ear pain.  RESPIRATORY: No cough,Positive shortness of breath, denies wheezing or hemoptysis.  CARDIOVASCULAR: No chest pain, orthopnea,Positive edema.  GASTROINTESTINAL: No nausea, vomiting, positive diarrhea denies abdominal pain.  GENITOURINARY: No dysuria, hematuria.  ENDOCRINE: No polyuria, nocturia,  HEMATOLOGY: No anemia, easy bruising or bleeding SKIN: No rash or lesion. MUSCULOSKELETAL: No joint pain or arthritis.   NEUROLOGIC: No tingling, numbness, weakness.  PSYCHIATRY: No anxiety or depression.   DRUG ALLERGIES:   Allergies  Allergen Reactions  . Amitriptyline Nausea And Vomiting  . Influenza Vaccines Other (See Comments)    Reaction:  Unknown   . Penicillins Itching and Other (See Comments)    Has patient had a PCN reaction causing immediate rash, facial/tongue/throat swelling, SOB or lightheadedness with hypotension: No Has patient had a PCN reaction causing severe rash involving mucus membranes or skin necrosis: No Has patient had a PCN reaction that required hospitalization No Has patient had a PCN reaction occurring within the last 10 years: No If all of the above answers are "NO", then may proceed with Cephalosporin use.  . Pneumococcal Vaccines Other (See Comments)    Reaction:  Unknown   . Rosiglitazone Other (See Comments)    Reaction:  Unknown   . Sulfa Antibiotics Itching    VITALS:  Blood pressure 112/40, pulse 72, temperature 98.2 F (36.8 C),  temperature source Oral, resp. rate 18, height 4\' 11"  (1.499 m), weight 197 lb 8 oz (89.585 kg), SpO2 95 %.  PHYSICAL EXAMINATION:  VITAL SIGNS: Filed Vitals:   11/02/15 0828 11/02/15 1144  BP: 113/51 112/40  Pulse: 74 72  Temp: 98 F (36.7 C) 98.2 F (36.8 C)  Resp: 20 18   GENERAL:82 y.o.female currently in no acute distress.  HEAD: Normocephalic, atraumatic.  EYES: Pupils equal, round, reactive to light. Extraocular muscles intact. No scleral icterus.  MOUTH: Moist mucosal membrane. Dentition intact. No abscess noted.  EAR, NOSE, THROAT: Clear without exudates. No external lesions.  NECK: Supple. No thyromegaly. No nodules. No JVD.  PULMONARY: Diminished breath sounds without wheeze rails or rhonci. No use of accessory muscles, Good respiratory effort. good air entry bilaterally CHEST: Nontender to palpation.  CARDIOVASCULAR: S1 and S2. Regular rate and rhythm. No murmurs, rubs, or gallops. 1+ edema. Pedal pulses 2+ bilaterally.  GASTROINTESTINAL: Soft, nontender, nondistended. No masses. Positive bowel sounds. No hepatosplenomegaly.  MUSCULOSKELETAL: No swelling, clubbing, or edema. Range of motion full in all extremities.  NEUROLOGIC: Cranial nerves II through XII are intact. No gross focal neurological deficits. Sensation intact. Reflexes intact.  SKIN: No ulceration, lesions, rashes, or cyanosis. Skin warm and dry. Turgor intact.  PSYCHIATRIC: Mood, affect within normal limits. The patient is awake, alert and oriented x 3. Insight, judgment intact.      LABORATORY PANEL:   CBC  Recent Labs Lab 11/01/15 0340  WBC 10.1  HGB 11.5*  HCT 36.5  PLT 255   ------------------------------------------------------------------------------------------------------------------  Chemistries   Recent Labs Lab 11/02/15 0327  NA 138  K 5.0  CL 108  CO2 21*  GLUCOSE 195*  BUN 80*  CREATININE 2.28*  CALCIUM 8.9    ------------------------------------------------------------------------------------------------------------------  Cardiac Enzymes  Recent Labs Lab 11/01/15 2245  TROPONINI 0.04*   ------------------------------------------------------------------------------------------------------------------  RADIOLOGY:  Dg Chest 2 View  10/31/2015  CLINICAL DATA:  Shortness of breath for 2 months. EXAM: CHEST  2 VIEW COMPARISON:  07/29/2011 FINDINGS: Cardiomediastinal silhouette is normal. Mediastinal contours appear intact. There is no evidence of focal airspace consolidation, pleural effusion or pneumothorax. The lungs are low volume with exaggeration of the interstitial markings. There is probable left lower lobe atelectasis. Osseous structures are without acute abnormality. Soft tissues are grossly normal. IMPRESSION: Prominent interstitial markings, which may be secondary to low lung volumes. Alternatively these may represent a mild pulmonary vascular congestion. Left lung base atelectasis. Electronically Signed   By: Ted Mcalpine M.D.   On: 10/31/2015 20:07    EKG:   Orders placed or performed during the hospital encounter of 10/31/15  . EKG 12-Lead  . EKG 12-Lead  . ED EKG  . ED EKG    ASSESSMENT AND PLAN:   [name] is a 82 y.o. female presenting with Shortness of Breath . Admitted 10/31/2015 : Day #: 2 days 1. Acute kidney injury on chronic kidney disease: Nephrology input pending patient originally sent them away however is now agreeable to actually be seen. Continue to hold Lasix and lisinopril in setting of worsening renal function 2. Diastolic congestive heart failure chronic: Cardiology input appreciated 3. Hyperkalemia: Hold lisinopril and follow potassium level IV. Type 2 diabetes poorly controlled: Appreciate that diabetic management recommendations restart basal insulin pre-meal and post meal coverage. 5. Chronic left otalgia: ENT input appreciated   All the  records are reviewed and case discussed with Care Management/Social Workerr. Management plans discussed with the patient, family and they are in agreement.  CODE STATUS: full TOTAL TIME TAKING CARE OF THIS PATIENT: 28 minutes.   POSSIBLE D/C IN 1-2DAYS, DEPENDING ON CLINICAL CONDITION.   Hower,  Mardi Mainland.D on 11/02/2015 at 1:59 PM  Between 7am to 6pm - Pager - [phone]  After 6pm: House Pager: - [phone]  [facility]  Office  [phone]  CC: Primary care physician; Hyman Hopes, MD

## 2015-11-02 NOTE — Progress Notes (Signed)
MD Crosley aware of pt's new troponin level of 0.04, pt is asymptomatic, no new orders received.

## 2015-11-02 NOTE — Progress Notes (Signed)
Inpatient Diabetes Program Recommendations  AACE/ADA: New Consensus Statement on Inpatient Glycemic Control (2015)  Target Ranges:  Prepandial:   less than 140 mg/dL      Peak postprandial:   less than 180 mg/dL (1-2 hours)      Critically ill patients:  140 - 180 mg/dL   Lab Results  Component Value Date   GLUCAP 164* 11/02/2015   HGBA1C 7.8* 11/01/2015    Review of Glycemic Control  Results for [name], [name] (MRN [id number]) as of 11/02/2015 10:19  Ref. Range 11/01/2015 18:26 11/01/2015 20:57 11/02/2015 00:26 11/02/2015 04:20 11/02/2015 07:33  Glucose-Capillary Latest Ref Range: 65-99 mg/dL 888 (H) 280 (H) 034 (H) 188 (H) 164 (H)    Diabetes history: Type 2 diabetes Outpatient Diabetes medications: Levemir 72 units q HS, Novolog 32 units tid with meals  Current orders for Inpatient glycemic control:  Levemir 72 units qhs, Novolog 32 units tid  Inpatient Diabetes Program Recommendations:  Agree with current orders for diabetes medication management.  If post prandial blood sugars are elevated today (despite current mealtime Novolog), consider adding Novolog sensitive correction (0-9 units) tid with meals  Susette Racer, RN, [place], [place], CDE Diabetes Coordinator Inpatient Diabetes Program  [phone] (Team Pager) [phone] [facility] Office) 11/02/2015 10:23 AM

## 2015-11-02 NOTE — Discharge Summary (Signed)
[facility] - [place] at [facility]   PATIENT NAME: [name]    MR#:  [id number]  DATE OF BIRTH:  07-01-1928  DATE OF ADMISSION:  10/31/2015 ADMITTING PHYSICIAN: Oralia Manis, MD  DATE OF DISCHARGE: 11/02/2015  PRIMARY CARE PHYSICIAN: Hyman Hopes, MD    ADMISSION DIAGNOSIS:  Shortness of breath [R06.02]  DISCHARGE DIAGNOSIS:  Acute on chronic diastolic heart hailure Acute on chronic kidney disease (CKDIV) Chronic left otalgia   SECONDARY DIAGNOSIS:   Past Medical History  Diagnosis Date  . Diabetes mellitus, insulin dependent (IDDM), uncontrolled (HCC)   . CKD (chronic kidney disease), stage III   . HLD (hyperlipidemia)   . Essential hypertension   . Gout   . Morbid obesity (HCC)   . Chronic diastolic CHF (congestive heart failure) (HCC)   . Osteoarthritis   . Asthma     HOSPITAL COURSE:  [name]  is a 82 y.o. female admitted 10/31/2015 with chief complaint Shortness of Breath . Please see H&P performed by Oralia Manis, MD for further information. Patient presented with the above findings in combination with volume overload. She was also complaining of left ear pain. Evaluation by ENT - Chronic left otalgia, follow up as outpatient as she already does. Evaluation by cardiology during this visit who aided in medication changes given worsening renal function. Nephrology was consulted - but patient refused to actually have them evaluate her in the hospital. Instead insisted on discharge without patient follow up. I stressed the importance of evaluation regardelss she wishes to follow as outpatient only. I have discussed with nephrology and they have graciously set up an appointment already.  DISCHARGE CONDITIONS:   stable  CONSULTS OBTAINED:  Treatment Team:  Almond Lint, MD Geanie Logan, MD Iran Ouch, MD Mosetta Pigeon, MD  DRUG ALLERGIES:   Allergies  Allergen Reactions  . Amitriptyline Nausea And Vomiting  . Influenza  Vaccines Other (See Comments)    Reaction:  Unknown   . Penicillins Itching and Other (See Comments)    Has patient had a PCN reaction causing immediate rash, facial/tongue/throat swelling, SOB or lightheadedness with hypotension: No Has patient had a PCN reaction causing severe rash involving mucus membranes or skin necrosis: No Has patient had a PCN reaction that required hospitalization No Has patient had a PCN reaction occurring within the last 10 years: No If all of the above answers are "NO", then may proceed with Cephalosporin use.  . Pneumococcal Vaccines Other (See Comments)    Reaction:  Unknown   . Rosiglitazone Other (See Comments)    Reaction:  Unknown   . Sulfa Antibiotics Itching    DISCHARGE MEDICATIONS:   Current Discharge Medication List    START taking these medications   Details  amLODipine (NORVASC) 10 MG tablet Take 1 tablet (10 mg total) by mouth daily. Qty: 30 tablet, Refills: 0    loperamide (IMODIUM) 2 MG capsule Take 1 capsule (2 mg total) by mouth as needed for diarrhea or loose stools. Qty: 30 capsule, Refills: 0    ofloxacin (OCUFLOX) 0.3 % ophthalmic solution Place 5 drops into the left ear 2 (two) times daily. Qty: 5 mL, Refills: 0      CONTINUE these medications which have CHANGED   Details  furosemide (LASIX) 40 MG tablet Take 0.5 tablets (20 mg total) by mouth daily. Qty: 30 tablet, Refills: 0      CONTINUE these medications which have NOT CHANGED   Details  albuterol (PROVENTIL HFA;VENTOLIN  HFA) 108 (90 Base) MCG/ACT inhaler Inhale 2 puffs into the lungs every 4 (four) hours as needed for wheezing or shortness of breath.    atorvastatin (LIPITOR) 20 MG tablet Take 20 mg by mouth at bedtime.    diphenoxylate-atropine (LOMOTIL) 2.5-0.025 MG tablet Take 1-2 tablets by mouth 4 (four) times daily as needed for diarrhea or loose stools.    gabapentin (NEURONTIN) 300 MG capsule Take 300 mg by mouth 3 (three) times daily.    insulin aspart  (NOVOLOG) 100 UNIT/ML injection Inject 32 Units into the skin 3 (three) times daily before meals.     insulin detemir (LEVEMIR) 100 UNIT/ML injection Inject 72 Units into the skin at bedtime.    metoprolol (LOPRESSOR) 50 MG tablet Take 50 mg by mouth 2 (two) times daily.    oxyCODONE-acetaminophen (PERCOCET/ROXICET) 5-325 MG tablet Take 1 tablet by mouth every 6 (six) hours as needed for severe pain.       STOP taking these medications     lisinopril (PRINIVIL,ZESTRIL) 40 MG tablet          DISCHARGE INSTRUCTIONS:  Would hold lisinopril and decrease lasix in the setting of worsening renal function until reevaluated by pcp or nephrology Start amlodipine (norvasc) for blood pressure while off of lisinopril  DIET:  Cardiac diet  DISCHARGE CONDITION:  Stable  ACTIVITY:  Activity as tolerated  OXYGEN:  Home Oxygen: No.   Oxygen Delivery: room air  DISCHARGE LOCATION:  home   If you experience worsening of your admission symptoms, develop shortness of breath, life threatening emergency, suicidal or homicidal thoughts you must seek medical attention immediately by calling 911 or calling your MD immediately  if symptoms less severe.  You Must read complete instructions/literature along with all the possible adverse reactions/side effects for all the Medicines you take and that have been prescribed to you. Take any new Medicines after you have completely understood and accpet all the possible adverse reactions/side effects.   Please note  You were cared for by a hospitalist during your hospital stay. If you have any questions about your discharge medications or the care you received while you were in the hospital after you are discharged, you can call the unit and asked to speak with the hospitalist on call if the hospitalist that took care of you is not available. Once you are discharged, your primary care physician will handle any further medical issues. Please note that NO  REFILLS for any discharge medications will be authorized once you are discharged, as it is imperative that you return to your primary care physician (or establish a relationship with a primary care physician if you do not have one) for your aftercare needs so that they can reassess your need for medications and monitor your lab values.    On the day of Discharge:   VITAL SIGNS:  Blood pressure 113/51, pulse 74, temperature 98 F (36.7 C), temperature source Oral, resp. rate 20, height 4\' 11"  (1.499 m), weight 197 lb 8 oz (89.585 kg), SpO2 95 %.  I/O:   Intake/Output Summary (Last 24 hours) at 11/02/15 1120 Last data filed at 11/02/15 1000  Gross per 24 hour  Intake    720 ml  Output    100 ml  Net    620 ml    PHYSICAL EXAMINATION:  GENERAL:  82 y.o.-year-old patient lying in the bed with no acute distress.  EYES: Pupils equal, round, reactive to light and accommodation. No scleral icterus. Extraocular muscles intact.  HEENT: Head atraumatic, normocephalic. Oropharynx and nasopharynx clear.  NECK:  Supple, no jugular venous distention. No thyroid enlargement, no tenderness.  LUNGS: Normal breath sounds bilaterally, no wheezing, rales,rhonchi or crepitation. No use of accessory muscles of respiration.  CARDIOVASCULAR: S1, S2 normal. No murmurs, rubs, or gallops.  ABDOMEN: Soft, non-tender, non-distended. Bowel sounds present. No organomegaly or mass.  EXTREMITIES: 1+ edema, cyanosis, or clubbing.  NEUROLOGIC: Cranial nerves II through XII are intact. Muscle strength 5/5 in all extremities. Sensation intact. Gait not checked.  PSYCHIATRIC: The patient is alert and oriented x 3.  SKIN: No obvious rash, lesion, or ulcer.   DATA REVIEW:   CBC  Recent Labs Lab 11/01/15 0340  WBC 10.1  HGB 11.5*  HCT 36.5  PLT 255    Chemistries   Recent Labs Lab 11/02/15 0327  NA 138  K 5.0  CL 108  CO2 21*  GLUCOSE 195*  BUN 80*  CREATININE 2.28*  CALCIUM 8.9    Cardiac  Enzymes  Recent Labs Lab 11/01/15 2245  TROPONINI 0.04*    Microbiology Results  Results for orders placed or performed in visit on 07/26/11  Wound culture     Status: None   Collection Time: 07/31/11 10:59 AM  Result Value Ref Range Status   Micro Text Report   Final       SOURCE: ??    COMMENT                   NO GROWTH AEROBICALLY/ANAEROBICALLY IN 4 DAYS   GRAM STAIN                NO WHITE BLOOD CELLS   GRAM STAIN                NO ORGANISMS SEEN   ANTIBIOTIC                                                        RADIOLOGY:  Dg Chest 2 View  10/31/2015  CLINICAL DATA:  Shortness of breath for 2 months. EXAM: CHEST  2 VIEW COMPARISON:  07/29/2011 FINDINGS: Cardiomediastinal silhouette is normal. Mediastinal contours appear intact. There is no evidence of focal airspace consolidation, pleural effusion or pneumothorax. The lungs are low volume with exaggeration of the interstitial markings. There is probable left lower lobe atelectasis. Osseous structures are without acute abnormality. Soft tissues are grossly normal. IMPRESSION: Prominent interstitial markings, which may be secondary to low lung volumes. Alternatively these may represent a mild pulmonary vascular congestion. Left lung base atelectasis. Electronically Signed   By: Ted Mcalpine M.D.   On: 10/31/2015 20:07     Management plans discussed with the patient, family and they are in agreement.  CODE STATUS:     Code Status Orders        Start     Ordered   10/31/15 2347  Full code   Continuous     10/31/15 2347    Code Status History    Date Active Date Inactive Code Status Order ID Comments User Context   This patient has a current code status but no historical code status.    Advance Directive Documentation        Most Recent Value   Type of Advance Directive  Healthcare Power of Attorney [son John]  Pre-existing out of facility DNR order (yellow form or pink MOST form)     "MOST" Form in  Place?        TOTAL TIME TAKING CARE OF THIS PATIENT: 32 minutes.    Hower,  Mardi Mainland.D on 11/02/2015 at 11:20 AM  Between 7am to 6pm - Pager - [phone]  After 6pm go to [email or web address] - Social research officer, government  [employer] [facility]  Office  [phone]  CC: Primary care physician; Hyman Hopes, MD

## 2015-11-03 ENCOUNTER — Inpatient Hospital Stay: Payer: Medicare Other

## 2015-11-03 LAB — BASIC METABOLIC PANEL
ANION GAP: 9 (ref 5–15)
Anion gap: 8 (ref 5–15)
BUN: 94 mg/dL — ABNORMAL HIGH (ref 6–20)
BUN: 96 mg/dL — AB (ref 6–20)
CALCIUM: 9.1 mg/dL (ref 8.9–10.3)
CALCIUM: 9.1 mg/dL (ref 8.9–10.3)
CHLORIDE: 105 mmol/L (ref 101–111)
CO2: 24 mmol/L (ref 22–32)
CO2: 23 mmol/L (ref 22–32)
CREATININE: 2.42 mg/dL — AB (ref 0.44–1.00)
CREATININE: 2.33 mg/dL — AB (ref 0.44–1.00)
Chloride: 105 mmol/L (ref 101–111)
GFR calc non Af Amer: 18 mL/min — ABNORMAL LOW (ref >60)
GFR, EST AFRICAN AMERICAN: 20 mL/min — AB (ref >60)
GFR, EST AFRICAN AMERICAN: 21 mL/min — AB (ref >60)
GFR, EST NON AFRICAN AMERICAN: 17 mL/min — AB (ref >60)
GLUCOSE: 183 mg/dL — AB (ref 65–99)
GLUCOSE: 163 mg/dL — AB (ref 65–99)
Potassium: 6 mmol/L — ABNORMAL HIGH (ref 3.5–5.1)
Potassium: 6 mmol/L — ABNORMAL HIGH (ref 3.5–5.1)
Sodium: 138 mmol/L (ref 135–145)
Sodium: 136 mmol/L (ref 135–145)

## 2015-11-03 LAB — GLUCOSE, CAPILLARY
GLUCOSE-CAPILLARY: 133 mg/dL — AB (ref 65–99)
Glucose-Capillary: 197 mg/dL — ABNORMAL HIGH (ref 65–99)
Glucose-Capillary: 149 mg/dL — ABNORMAL HIGH (ref 65–99)
Glucose-Capillary: 256 mg/dL — ABNORMAL HIGH (ref 65–99)
Glucose-Capillary: 182 mg/dL — ABNORMAL HIGH (ref 65–99)
Glucose-Capillary: 127 mg/dL — ABNORMAL HIGH (ref 65–99)

## 2015-11-03 LAB — PROTEIN / CREATININE RATIO, URINE
CREATININE, URINE: 85 mg/dL
PROTEIN CREATININE RATIO: 0.19 mg/mg{creat} — AB (ref 0.00–0.15)
TOTAL PROTEIN, URINE: 16 mg/dL

## 2015-11-03 MED ORDER — PATIROMER SORBITEX CALCIUM 8.4 G PO PACK
8.4000 g | PACK | Freq: Every day | ORAL | Status: DC
  Administered 2015-11-03 – 2015-11-05 (×3): 8.4 g via ORAL
  Filled 2015-11-03 (×3): qty 4

## 2015-11-03 MED ORDER — DEXTROSE 50 % IV SOLN
25.0000 mL | Freq: Once | INTRAVENOUS | Status: AC
  Administered 2015-11-03: 25 mL via INTRAVENOUS
  Filled 2015-11-03: qty 50

## 2015-11-03 MED ORDER — INSULIN ASPART 100 UNIT/ML IV SOLN
10.0000 [IU] | Freq: Once | INTRAVENOUS | Status: AC
  Administered 2015-11-03: 10 [IU] via INTRAVENOUS
  Filled 2015-11-03: qty 0.1

## 2015-11-03 MED ORDER — AMLODIPINE BESYLATE 5 MG PO TABS
5.0000 mg | ORAL_TABLET | Freq: Every day | ORAL | Status: DC
  Administered 2015-11-04 – 2015-11-05 (×2): 5 mg via ORAL
  Filled 2015-11-03 (×2): qty 1

## 2015-11-03 NOTE — Care Management Important Message (Signed)
Important Message  Patient Details  Name: [name] MRN: [id number] Date of Birth: 1928/09/09   Medicare Important Message Given:  Yes    Eber Hong, RN 11/03/2015, 8:55 AM

## 2015-11-03 NOTE — Care Management (Signed)
Spoke with patient and her daughter regarding the recommendation of home health physical therapy and nursing.  Daughter says that she and her brother will think about it and decide.  Will certainly consider if is recommended by attending.  Provided list of home health agencies to consider.  no preference

## 2015-11-03 NOTE — Progress Notes (Signed)
Physical Therapy Treatment Patient Details Name: [name] MRN: [id number] DOB: 1929-03-01 Today's Date: 11/03/2015    History of Present Illness 82 yo F presented to ED for SOB x2 months and  L anterior ear pain for 6 months after an infected lymphnode. She was found to have acute on chronic diastolic CHF. PMH includes DM, CKD, HLD, HTN, and gout.     PT Comments    Pt continues to require min guard for mobility, primarily due to safety precautions. She is highly impulsive and doesn't correct her technique with cues given. Pt ambulated 70 ft with FWW and min guard. Steady with no LOB, but impulsive with FWW very far ahead. SpO2 94% on RA. Pt declined further exercise during this session. Progress mobility as tolerated.  Follow Up Recommendations  Home health PT;Supervision/Assistance - 24 hour     Equipment Recommendations  None recommended by PT;Other (comment)    Recommendations for Other Services       Precautions / Restrictions Precautions Precautions: Fall Restrictions Weight Bearing Restrictions: No    Mobility  Bed Mobility               General bed mobility comments: up in recliner, NT  Transfers Overall transfer level: Needs assistance Equipment used: Rolling walker (2 wheeled) Transfers: Sit to/from [employer] Sit to Stand: Min guard Stand pivot transfers: Min guard       General transfer comment: Cues for hand placement on chair, tends to pull up on walker  Ambulation/Gait Ambulation/Gait assistance: Min guard Ambulation Distance (Feet): 70 Feet Assistive device: Rolling walker (2 wheeled) Gait Pattern/deviations: Decreased stride length;Trunk flexed Gait velocity: reduced   General Gait Details: impulsive, no LOB, FWW very far infront   Stairs            Wheelchair Mobility    Modified Rankin (Stroke Patients Only)       Balance Overall balance assessment: Needs assistance Sitting-balance support: No upper  extremity supported Sitting balance-Leahy Scale: Good     Standing balance support: Bilateral upper extremity supported Standing balance-Leahy Scale: Good Standing balance comment: steady with no LOB                    Cognition Arousal/Alertness: Awake/alert Behavior During Therapy: Impulsive Overall Cognitive Status: Within Functional Limits for tasks assessed                      Exercises Other Exercises Other Exercises: Pt ambulated 70 ft with FWW and min guard. Impulsive but steady gait with no LOB. Tends to push FWW very far ahead of her and requires frequent cues for staying close to [facility]. Pt demonstrated no correction with cues. Pt declined to further participate in therapy during this session.    General Comments General comments (skin integrity, edema, etc.): B LE pitting edema      Pertinent Vitals/Pain Pain Assessment: No/denies pain    Home Living                      Prior Function            PT Goals (current goals can now be found in the care plan section) Acute Rehab PT Goals Patient Stated Goal: to go home PT Goal Formulation: With patient/family Time For Goal Achievement: 11/15/15 Potential to Achieve Goals: Fair Progress towards PT goals: Progressing toward goals    Frequency  Min 2X/week    PT Plan Current  plan remains appropriate    Co-evaluation             End of Session Equipment Utilized During Treatment: Gait belt Activity Tolerance: Patient tolerated treatment well Patient left: in chair;with call bell/phone within reach;with chair alarm set;with family/visitor present     Time: 6659-9357 PT Time Calculation (min) (ACUTE ONLY): 17 min  Charges:  $Gait Training: 8-22 mins                    G Codes:      Adelene Idler, PT, DPT  11/03/2015, 1:35 PM [phone]

## 2015-11-03 NOTE — Progress Notes (Signed)
[facility] - [place] at [facility]   PATIENT NAME: [name]    MRN#:  [id number]  DATE OF BIRTH:  11-Jan-1929  SUBJECTIVE:  Hospital Day: 3 days [name] is a 82 y.o. female presenting with Shortness of Breath .   Overnight events: No overnight events Interval Events: No complaints patient states wants to go home  REVIEW OF SYSTEMS:  CONSTITUTIONAL: No fever, fatigue or weakness.  EYES: No blurred or double vision.  EARS, NOSE, AND THROAT: No tinnitus Positive ear pain.  RESPIRATORY: No cough,Positive shortness of breath, denies wheezing or hemoptysis.  CARDIOVASCULAR: No chest pain, orthopnea,Positive edema.  GASTROINTESTINAL: No nausea, vomiting, positive diarrhea denies abdominal pain.  GENITOURINARY: No dysuria, hematuria.  ENDOCRINE: No polyuria, nocturia,  HEMATOLOGY: No anemia, easy bruising or bleeding SKIN: No rash or lesion. MUSCULOSKELETAL: No joint pain or arthritis.   NEUROLOGIC: No tingling, numbness, weakness.  PSYCHIATRY: No anxiety or depression.   DRUG ALLERGIES:   Allergies  Allergen Reactions  . Amitriptyline Nausea And Vomiting  . Influenza Vaccines Other (See Comments)    Reaction:  Unknown   . Penicillins Itching and Other (See Comments)    Has patient had a PCN reaction causing immediate rash, facial/tongue/throat swelling, SOB or lightheadedness with hypotension: No Has patient had a PCN reaction causing severe rash involving mucus membranes or skin necrosis: No Has patient had a PCN reaction that required hospitalization No Has patient had a PCN reaction occurring within the last 10 years: No If all of the above answers are "NO", then may proceed with Cephalosporin use.  . Pneumococcal Vaccines Other (See Comments)    Reaction:  Unknown   . Rosiglitazone Other (See Comments)    Reaction:  Unknown   . Sulfa Antibiotics Itching    VITALS:  Blood pressure 130/37, pulse 69, temperature 98.1 F (36.7 C),  temperature source Oral, resp. rate 18, height 4\' 11"  (1.499 m), weight 193 lb 6.9 oz (87.74 kg), SpO2 96 %.  PHYSICAL EXAMINATION:  VITAL SIGNS: Filed Vitals:   11/03/15 0916 11/03/15 1144  BP: 137/52 130/37  Pulse: 73 69  Temp:  98.1 F (36.7 C)  Resp:     GENERAL:82 y.o.female currently in no acute distress.  HEAD: Normocephalic, atraumatic.  EYES: Pupils equal, round, reactive to light. Extraocular muscles intact. No scleral icterus.  MOUTH: Moist mucosal membrane. Dentition intact. No abscess noted.  EAR, NOSE, THROAT: Clear without exudates. No external lesions.  NECK: Supple. No thyromegaly. No nodules. No JVD.  PULMONARY: Diminished breath sounds without wheeze rails or rhonci. No use of accessory muscles, Good respiratory effort. good air entry bilaterally CHEST: Nontender to palpation.  CARDIOVASCULAR: S1 and S2. Regular rate and rhythm. No murmurs, rubs, or gallops. 1+ edema. Pedal pulses 2+ bilaterally.  GASTROINTESTINAL: Soft, nontender, nondistended. No masses. Positive bowel sounds. No hepatosplenomegaly.  MUSCULOSKELETAL: No swelling, clubbing, or edema. Range of motion full in all extremities.  NEUROLOGIC: Cranial nerves II through XII are intact. No gross focal neurological deficits. Sensation intact. Reflexes intact.  SKIN: No ulceration, lesions, rashes, or cyanosis. Skin warm and dry. Turgor intact.  PSYCHIATRIC: Mood, affect within normal limits. The patient is awake, alert and oriented x 3. Insight, judgment intact.      LABORATORY PANEL:   CBC  Recent Labs Lab 11/01/15 0340  WBC 10.1  HGB 11.5*  HCT 36.5  PLT 255   ------------------------------------------------------------------------------------------------------------------  Chemistries   Recent Labs Lab 11/03/15 0417  NA 138  K 6.0*  CL 105  CO2 24  GLUCOSE 183*  BUN 94*  CREATININE 2.42*  CALCIUM 9.1    ------------------------------------------------------------------------------------------------------------------  Cardiac Enzymes  Recent Labs Lab 11/01/15 2245  TROPONINI 0.04*   ------------------------------------------------------------------------------------------------------------------  RADIOLOGY:  No results found.  EKG:   Orders placed or performed during the hospital encounter of 10/31/15  . EKG 12-Lead  . EKG 12-Lead  . ED EKG  . ED EKG    ASSESSMENT AND PLAN:   [name] is a 82 y.o. female presenting with Shortness of Breath . Admitted 10/31/2015 : Day #: 3 days 1. Acute kidney injury on chronic kidney disease: Nephrology input pending  Continue to hold Lasix and lisinopril in setting of worsening renal function 2. Diastolic congestive heart failure chronic: Cardiology input appreciated 3. Hyperkalemia: Insulin/dextrose-Hold lisinopril and follow potassium level IV. Type 2 diabetes poorly controlled: Appreciate that diabetic management recommendations restart basal insulin pre-meal and post meal coverage. 5. Chronic left otalgia: ENT input appreciated   All the records are reviewed and case discussed with Care Management/Social Workerr. Management plans discussed with the patient, family and they are in agreement.  CODE STATUS: full TOTAL TIME TAKING CARE OF THIS PATIENT: 28 minutes.   POSSIBLE D/C IN 1-2DAYS, DEPENDING ON CLINICAL CONDITION.   Hower,  Mardi Mainland.D on 11/03/2015 at 2:06 PM  Between 7am to 6pm - Pager - [phone]  After 6pm: House Pager: - [phone]  [facility]  Office  [phone]  CC: Primary care physician; Hyman Hopes, MD

## 2015-11-03 NOTE — Consult Note (Signed)
Date: 11/03/2015                  Patient Name:  [name]  MRN: [id number]  DOB: 1929/02/16  Age / Sex: 82 y.o., female         PCP: Hyman Hopes, MD                 Service Requesting Consult: Internal medicine                 Reason for Consult: Acute renal failure            History of Present Illness: Patient is a 82 y.o. female with medical problems of Diabetes type 2, chronic kidney disease, hyperlipidemia, hypertension, gout, morbid obesity, grade 1 diastolic dysfunction, who was admitted to [facility] on 10/31/2015 for evaluation of shortness of breath.  She presented for worsening leg edema, shortness of breath and orthopnea She was treated with IV Lasix which resulted in improvement in her symptoms Baseline creatinine appears to be 2.1/GFR 21 at the time of admission Creatinine has slowly increased to 2.42, BUN of 91 today Nephrology consult has been requested for evaluation  Medications: Outpatient medications: Prescriptions prior to admission  Medication Sig Dispense Refill Last Dose  . albuterol (PROVENTIL HFA;VENTOLIN HFA) 108 (90 Base) MCG/ACT inhaler Inhale 2 puffs into the lungs every 4 (four) hours as needed for wheezing or shortness of breath.   10/31/2015 at Unknown time  . atorvastatin (LIPITOR) 20 MG tablet Take 20 mg by mouth at bedtime.   10/30/2015 at Unknown time  . diphenoxylate-atropine (LOMOTIL) 2.5-0.025 MG tablet Take 1-2 tablets by mouth 4 (four) times daily as needed for diarrhea or loose stools.   Past Week at Unknown time  . gabapentin (NEURONTIN) 300 MG capsule Take 300 mg by mouth 3 (three) times daily.   10/31/2015 at Unknown time  . insulin aspart (NOVOLOG) 100 UNIT/ML injection Inject 32 Units into the skin 3 (three) times daily before meals.    10/31/2015 at Unknown time  . insulin detemir (LEVEMIR) 100 UNIT/ML injection Inject 72 Units into the skin at bedtime.   10/30/2015 at Unknown time  . lisinopril (PRINIVIL,ZESTRIL) 40 MG tablet Take 40 mg by  mouth 2 (two) times daily.   10/31/2015 at Unknown time  . metoprolol (LOPRESSOR) 50 MG tablet Take 50 mg by mouth 2 (two) times daily.   10/31/2015 at 0800  . oxyCODONE-acetaminophen (PERCOCET/ROXICET) 5-325 MG tablet Take 1 tablet by mouth every 6 (six) hours as needed for severe pain.    10/31/2015 at 0800  . [DISCONTINUED] furosemide (LASIX) 40 MG tablet Take 40 mg by mouth daily.   10/31/2015 at Unknown time    Current medications: Current Facility-Administered Medications  Medication Dose Route Frequency Provider Last Rate Last Dose  . acetaminophen (TYLENOL) tablet 650 mg  650 mg Oral Q6H PRN Oralia Manis, MD       Or  . acetaminophen (TYLENOL) suppository 650 mg  650 mg Rectal Q6H PRN Oralia Manis, MD      . albuterol (PROVENTIL) (2.5 MG/3ML) 0.083% nebulizer solution 3 mL  3 mL Inhalation Q4H PRN Oralia Manis, MD   3 mL at 11/01/15 0302  . amLODipine (NORVASC) tablet 10 mg  10 mg Oral Daily Wyatt Haste, MD   10 mg at 11/03/15 0916  . gabapentin (NEURONTIN) capsule 300 mg  300 mg Oral BID Oralia Manis, MD   300 mg at 11/03/15 0916  . heparin  injection 5,000 Units  5,000 Units Subcutaneous Q8H Oralia Manis, MD   5,000 Units at 11/03/15 1423  . hydrALAZINE (APRESOLINE) injection 10 mg  10 mg Intravenous Q4H PRN Wyatt Haste, MD   10 mg at 11/01/15 1650  . insulin aspart (novoLOG) injection 32 Units  32 Units Subcutaneous TID WC Wyatt Haste, MD   32 Units at 11/03/15 1231  . insulin detemir (LEVEMIR) injection 72 Units  72 Units Subcutaneous QHS Wyatt Haste, MD   72 Units at 11/02/15 2127  . loperamide (IMODIUM) capsule 2 mg  2 mg Oral PRN Wyatt Haste, MD   2 mg at 11/01/15 2206  . metoprolol (LOPRESSOR) tablet 50 mg  50 mg Oral BID Oralia Manis, MD   50 mg at 11/03/15 0916  . ofloxacin (OCUFLOX) 0.3 % ophthalmic solution 5 drop  5 drop Left Ear BID Geanie Logan, MD   5 drop at 11/03/15 0916  . ondansetron (ZOFRAN) tablet 4 mg  4 mg Oral Q6H PRN Oralia Manis, MD       Or  . ondansetron  [facility]) injection 4 mg  4 mg Intravenous Q6H PRN Oralia Manis, MD      . oxyCODONE-acetaminophen (PERCOCET/ROXICET) 5-325 MG per tablet 1 tablet  1 tablet Oral Q6H PRN Wyatt Haste, MD   1 tablet at 11/03/15 1426  . sodium chloride flush (NS) 0.9 % injection 3 mL  3 mL Intravenous Q12H Oralia Manis, MD   3 mL at 11/03/15 1000      Allergies: Allergies  Allergen Reactions  . Amitriptyline Nausea And Vomiting  . Influenza Vaccines Other (See Comments)    Reaction:  Unknown   . Penicillins Itching and Other (See Comments)    Has patient had a PCN reaction causing immediate rash, facial/tongue/throat swelling, SOB or lightheadedness with hypotension: No Has patient had a PCN reaction causing severe rash involving mucus membranes or skin necrosis: No Has patient had a PCN reaction that required hospitalization No Has patient had a PCN reaction occurring within the last 10 years: No If all of the above answers are "NO", then may proceed with Cephalosporin use.  . Pneumococcal Vaccines Other (See Comments)    Reaction:  Unknown   . Rosiglitazone Other (See Comments)    Reaction:  Unknown   . Sulfa Antibiotics Itching      Past Medical History: Past Medical History  Diagnosis Date  . Diabetes mellitus, insulin dependent (IDDM), uncontrolled (HCC)   . CKD (chronic kidney disease), stage III   . HLD (hyperlipidemia)   . Essential hypertension   . Gout   . Morbid obesity (HCC)   . Chronic diastolic CHF (congestive heart failure) (HCC)   . Osteoarthritis   . Asthma      Past Surgical History: Past Surgical History  Procedure Laterality Date  . Ovarian cyst removal       Family History: Family History  Problem Relation Age of Onset  . Melanoma Mother      Social History: Social History   Social History  . Marital Status: Unknown    Spouse Name: N/A  . Number of Children: N/A  . Years of Education: N/A   Occupational History  . Not on file.   Social History Main  Topics  . Smoking status: Never Smoker   . Smokeless tobacco: Not on file  . Alcohol Use: No  . Drug Use: No  . Sexual Activity: Not on file   Other Topics Concern  .  Not on file   Social History Narrative     Review of Systems: Gen: no fevers, chills, no significant weight loss HEENT: no complaints CV: dyspnea on exertion, shortness of breath, orthopnea Resp: cough, sputum RJ:JOACZYSA is good, diarrhea chronic GU : no problems reported MS: no acute problems reported Derm:  no complaints Psych:no complaints Heme: no complaints Neuro: no complaints Endocrine no complaints  Vital Signs: Blood pressure 130/37, pulse 69, temperature 98.1 F (36.7 C), temperature source Oral, resp. rate 18, height 4\' 11"  (1.499 m), weight 87.74 kg (193 lb 6.9 oz), SpO2 96 %.   Intake/Output Summary (Last 24 hours) at 11/03/15 1440 Last data filed at 11/03/15 0710  Gross per 24 hour  Intake    240 ml  Output      0 ml  Net    240 ml    Weight trends: Filed Weights   11/01/15 0523 11/02/15 0656 11/03/15 0430  Weight: 91.581 kg (201 lb 14.4 oz) 89.585 kg (197 lb 8 oz) 87.74 kg (193 lb 6.9 oz)    Physical Exam: General:  no acute distress, sitting up in chair, morbidly obese, central obesity  HEENT Anicteric, moist mucous membranes  Neck:  supple, no masses  Lungs: Fine crackles at bases, otherwise clear, normal effort  Heart::  irregular rhythm, soft systolic murmur  Abdomen: Soft, distended, nontender  Extremities:  2+ pitting edema  Neurologic: Alert, able to answer questions  Skin: No acute rashes             Lab results: Basic Metabolic Panel:  Recent Labs Lab 11/01/15 0340 11/02/15 0327 11/03/15 0417  NA 139 138 138  K 5.7* 5.0 6.0*  CL 111 108 105  CO2 20* 21* 24  GLUCOSE 238* 195* 183*  BUN 78* 80* 94*  CREATININE 2.09* 2.28* 2.42*  CALCIUM 8.9 8.9 9.1    Liver Function Tests: No results for input(s): AST, ALT, ALKPHOS, BILITOT, PROT, ALBUMIN in the  last 168 hours. No results for input(s): LIPASE, AMYLASE in the last 168 hours. No results for input(s): AMMONIA in the last 168 hours.  CBC:  Recent Labs Lab 10/31/15 1935 11/01/15 0340  WBC 8.0 10.1  HGB 10.9* 11.5*  HCT 34.4* 36.5  MCV 84.6 82.7  PLT 225 255    Cardiac Enzymes:  Recent Labs Lab 11/01/15 2245  TROPONINI 0.04*    BNP: Invalid input(s): POCBNP  CBG:  Recent Labs Lab 11/02/15 2124 11/03/15 0428 11/03/15 0745 11/03/15 1013 11/03/15 1143  GLUCAP 174* 197* 149* 256* 182*    Microbiology: No results found for this or any previous visit (from the past 720 hour(s)).   Coagulation Studies: No results for input(s): LABPROT, INR in the last 72 hours.  Urinalysis: No results for input(s): COLORURINE, LABSPEC, PHURINE, GLUCOSEU, HGBUR, BILIRUBINUR, KETONESUR, PROTEINUR, UROBILINOGEN, NITRITE, LEUKOCYTESUR in the last 72 hours.  Invalid input(s): APPERANCEUR      Imaging:  No results found.   Assessment & Plan: Pt is a 82 y.o. yo female with a PMHX of Diabetes type 2, chronic kidney disease, hyperlipidemia, hypertension, gout, morbid obesity, grade 1 diastolic dysfunction, who was admitted to [facility] on 10/31/2015 for evaluation of shortness of breath.  She presented for worsening leg edema, shortness of breath and orthopnea  1.  Acute renal failure on chronic kidney disease stage IV Baseline creatinine 2.1/GFR 21.  Likely diabetic nephropathy and atherosclerotic renal disease We will obtain an ultrasound, urine protein/creatinine ratio Acute worsening of renal parameters seem to be  related to aggressive diuresis and a short period of time At present, her volume status and hemodynamics are acceptable Renal function is expected to improve in the next 1-2 days  2.  Hyperkalemia - Recommend low potassium diet - Trial of Veltassa  3. Lower extremity edema - Patient is unable to tolerate compression socks - Consider decreasing the dose of  amlodipine

## 2015-11-03 NOTE — Progress Notes (Addendum)
Inpatient Diabetes Program Recommendations  AACE/ADA: New Consensus Statement on Inpatient Glycemic Control (2015)  Target Ranges:  Prepandial:   less than 140 mg/dL      Peak postprandial:   less than 180 mg/dL (1-2 hours)      Critically ill patients:  140 - 180 mg/dL   Lab Results  Component Value Date   GLUCAP 256* 11/03/2015   HGBA1C 7.8* 11/01/2015    Review of Glycemic Control  Results for [name], [name] (MRN [id number]) as of 11/03/2015 11:28  Ref. Range 11/02/2015 17:46 11/02/2015 21:24 11/03/2015 04:28 11/03/2015 07:45 11/03/2015 10:13  Glucose-Capillary Latest Ref Range: 65-99 mg/dL 812 (H) 751 (H) 700 (H) 149 (H) 256 (H)    Diabetes history: Type 2 diabetes Outpatient Diabetes medications: Levemir 72 units q HS, Novolog 32 units tid with meals  Current orders for Inpatient glycemic control: Levemir 72 units qhs, Novolog 32 units tid  Inpatient Diabetes Program Recommendations: Agree with current orders for diabetes medication management.  Consider changing diet to heart healthy/carb modified.   Gabrielle Racer, RN, BA, MHA, CDE Diabetes Coordinator Inpatient Diabetes Program  [phone] (Team Pager) [phone] [facility] Office) 11/03/2015 11:30 AM

## 2015-11-04 LAB — COMPREHENSIVE METABOLIC PANEL
ALBUMIN: 3.4 g/dL — AB (ref 3.5–5.0)
ALT: 23 U/L (ref 14–54)
ANION GAP: 8 (ref 5–15)
AST: 27 U/L (ref 15–41)
Alkaline Phosphatase: 63 U/L (ref 38–126)
BILIRUBIN TOTAL: 0.2 mg/dL — AB (ref 0.3–1.2)
BUN: 97 mg/dL — ABNORMAL HIGH (ref 6–20)
CO2: 21 mmol/L — AB (ref 22–32)
Calcium: 8.9 mg/dL (ref 8.9–10.3)
Chloride: 107 mmol/L (ref 101–111)
Creatinine, Ser: 2.17 mg/dL — ABNORMAL HIGH (ref 0.44–1.00)
GFR calc non Af Amer: 20 mL/min — ABNORMAL LOW (ref >60)
GFR, EST AFRICAN AMERICAN: 23 mL/min — AB (ref >60)
GLUCOSE: 152 mg/dL — AB (ref 65–99)
POTASSIUM: 6.5 mmol/L — AB (ref 3.5–5.1)
SODIUM: 136 mmol/L (ref 135–145)
TOTAL PROTEIN: 6.9 g/dL (ref 6.5–8.1)

## 2015-11-04 LAB — CBC
HCT: 34.3 % — ABNORMAL LOW (ref 35.0–47.0)
Hemoglobin: 10.8 g/dL — ABNORMAL LOW (ref 12.0–16.0)
MCH: 26.6 pg (ref 26.0–34.0)
MCHC: 31.6 g/dL — AB (ref 32.0–36.0)
MCV: 84.2 fL (ref 80.0–100.0)
Platelets: 259 10*3/uL (ref 150–440)
RBC: 4.07 MIL/uL (ref 3.80–5.20)
RDW: 18.3 % — AB (ref 11.5–14.5)
WBC: 8.2 10*3/uL (ref 3.6–11.0)

## 2015-11-04 LAB — GLUCOSE, CAPILLARY
GLUCOSE-CAPILLARY: 153 mg/dL — AB (ref 65–99)
GLUCOSE-CAPILLARY: 156 mg/dL — AB (ref 65–99)
GLUCOSE-CAPILLARY: 166 mg/dL — AB (ref 65–99)
Glucose-Capillary: 155 mg/dL — ABNORMAL HIGH (ref 65–99)
Glucose-Capillary: 159 mg/dL — ABNORMAL HIGH (ref 65–99)
Glucose-Capillary: 177 mg/dL — ABNORMAL HIGH (ref 65–99)

## 2015-11-04 LAB — BASIC METABOLIC PANEL
Anion gap: 10 (ref 5–15)
BUN: 99 mg/dL — AB (ref 6–20)
CALCIUM: 9.5 mg/dL (ref 8.9–10.3)
CO2: 23 mmol/L (ref 22–32)
Chloride: 101 mmol/L (ref 101–111)
Creatinine, Ser: 2.21 mg/dL — ABNORMAL HIGH (ref 0.44–1.00)
GFR calc Af Amer: 22 mL/min — ABNORMAL LOW (ref >60)
GFR, EST NON AFRICAN AMERICAN: 19 mL/min — AB (ref >60)
Glucose, Bld: 200 mg/dL — ABNORMAL HIGH (ref 65–99)
POTASSIUM: 5.6 mmol/L — AB (ref 3.5–5.1)
SODIUM: 134 mmol/L — AB (ref 135–145)

## 2015-11-04 MED ORDER — SODIUM POLYSTYRENE SULFONATE 15 GM/60ML PO SUSP
30.0000 g | Freq: Once | ORAL | Status: AC
  Administered 2015-11-04: 30 g via ORAL
  Filled 2015-11-04: qty 120

## 2015-11-04 MED ORDER — FUROSEMIDE 40 MG PO TABS
40.0000 mg | ORAL_TABLET | Freq: Two times a day (BID) | ORAL | Status: DC
  Administered 2015-11-04 – 2015-11-05 (×3): 40 mg via ORAL
  Filled 2015-11-04 (×3): qty 1

## 2015-11-04 MED ORDER — SODIUM CHLORIDE 0.9 % IV SOLN
1.0000 g | Freq: Once | INTRAVENOUS | Status: AC
  Administered 2015-11-04: 1 g via INTRAVENOUS
  Filled 2015-11-04: qty 10

## 2015-11-04 NOTE — Progress Notes (Signed)
Patient ID: [name], female   DOB: November 03, 1928, 82 y.o.   MRN: [id number] [facility] PROGRESS NOTE  [name] UKG:[id number] DOB: 1928-06-01 DOA: 10/31/2015 PCP: Hyman Hopes, MD  HPI/Subjective: Patient feels well and wants to go home. She understands with her potassium being 6.5 that she can go home today. Offers no complaints.  Objective: Filed Vitals:   11/04/15 0750 11/04/15 1130  BP: 146/50 144/66  Pulse: 65 76  Temp: 97.9 F (36.6 C) 98.3 F (36.8 C)  Resp: 14 20    Filed Weights   11/02/15 0656 11/03/15 0430 11/04/15 0415  Weight: 89.585 kg (197 lb 8 oz) 87.74 kg (193 lb 6.9 oz) 86.42 kg (190 lb 8.3 oz)    ROS: Review of Systems  Constitutional: Negative for fever and chills.  Eyes: Negative for blurred vision.  Respiratory: Negative for cough and shortness of breath.   Cardiovascular: Negative for chest pain.  Gastrointestinal: Positive for diarrhea. Negative for nausea, vomiting, abdominal pain and constipation.  Genitourinary: Negative for dysuria.  Musculoskeletal: Negative for joint pain.  Neurological: Negative for dizziness and headaches.   Exam: Physical Exam  Constitutional: She is oriented to person, place, and time.  HENT:  Nose: No mucosal edema.  Mouth/Throat: No oropharyngeal exudate or posterior oropharyngeal edema.  Eyes: Conjunctivae, EOM and lids are normal. Pupils are equal, round, and reactive to light.  Neck: No JVD present. Carotid bruit is not present. No edema present. No thyroid mass and no thyromegaly present.  Cardiovascular: S1 normal and S2 normal.  Exam reveals no gallop.   No murmur heard. Pulses:      Dorsalis pedis pulses are 2+ on the right side, and 2+ on the left side.  Respiratory: No respiratory distress. She has no wheezes. She has no rhonchi. She has no rales.  GI: Soft. Bowel sounds are normal. There is no tenderness.  Musculoskeletal:       Right ankle: She exhibits swelling.       Left ankle: She  exhibits swelling.  Lymphadenopathy:    She has no cervical adenopathy.  Neurological: She is alert and oriented to person, place, and time. No cranial nerve deficit.  Skin: Skin is warm. No rash noted. Nails show no clubbing.  Psychiatric: She has a normal mood and affect.      Data Reviewed: Basic Metabolic Panel:  Recent Labs Lab 11/02/15 0327 11/03/15 0417 11/03/15 1506 11/04/15 0517 11/04/15 1301  NA 138 138 136 136 134*  K 5.0 6.0* 6.0* 6.5* 5.6*  CL 108 105 105 107 101  CO2 21* 24 23 21* 23  GLUCOSE 195* 183* 163* 152* 200*  BUN 80* 94* 96* 97* 99*  CREATININE 2.28* 2.42* 2.33* 2.17* 2.21*  CALCIUM 8.9 9.1 9.1 8.9 9.5   Liver Function Tests:  Recent Labs Lab 11/04/15 0517  AST 27  ALT 23  ALKPHOS 63  BILITOT 0.2*  PROT 6.9  ALBUMIN 3.4*   CBC:  Recent Labs Lab 10/31/15 1935 11/01/15 0340 11/04/15 0517  WBC 8.0 10.1 8.2  HGB 10.9* 11.5* 10.8*  HCT 34.4* 36.5 34.3*  MCV 84.6 82.7 84.2  PLT 225 255 259   Cardiac Enzymes:  Recent Labs Lab 10/31/15 1935 11/01/15 1240 11/01/15 1928 11/01/15 2245  TROPONINI 0.03 <0.03 0.03 0.04*   BNP (last 3 results)  Recent Labs  10/31/15 1935  BNP 183.0*    CBG:  Recent Labs Lab 11/03/15 2151 11/04/15 0014 11/04/15 0530 11/04/15 0745 11/04/15 1130  GLUCAP 133* 159* 153* 156* 166*      Studies: US Renal  11/03/2015  CLINICAL DATA:  Acute renal failure EXAM: RENAL / URINARY TRACT ULTRASOUND COMPLETE COMPARISON:  None. FINDINGS: Right Kidney: Length: 8.5 cm. Echogenicity within normal limits. No mass or hydronephrosis visualized. Cortical thinning. Left Kidney: Length: 8.4 cm. Echogenicity within normal limits. No mass or hydronephrosis visualized. Cortical thinning. Bladder: Appears normal for degree of bladder distention. IMPRESSION: Bilateral cortical atrophy, symmetric Electronically Signed   By: Esperanza Heir M.D.   On: 11/03/2015 16:10    Scheduled Meds: . amLODipine  5 mg Oral Daily   . furosemide  40 mg Oral BID  . gabapentin  300 mg Oral BID  . heparin  5,000 Units Subcutaneous Q8H  . insulin aspart  32 Units Subcutaneous TID WC  . insulin detemir  72 Units Subcutaneous QHS  . metoprolol  50 mg Oral BID  . ofloxacin  5 drop Left Ear BID  . patiromer  8.4 g Oral Daily  . sodium chloride flush  3 mL Intravenous Q12H   Assessment/Plan:  1. Hyperkalemia with a potassium of 6.5. Patient was started on Patiromer yesterday. Case discussed with nephrology. I will give a dose of Kayexalate today and monitor potassium throughout the day. 2. Acute kidney injury on chronic kidney disease stage IV. 3. Lower extremity edema. Oral Lasix and decreased dose of amlodipine 4. Type 2 diabetes mellitus on detemir insulin and aspart insulin prior to meals 5. Essential hypertension. Continue to monitor on current medications  Code Status:     Code Status Orders        Start     Ordered   10/31/15 2347  Full code   Continuous     10/31/15 2347    Code Status History    Date Active Date Inactive Code Status Order ID Comments User Context   This patient has a current code status but no historical code status.    Advance Directive Documentation        Most Recent Value   Type of Advance Directive  Healthcare Power of Attorney [son John]   Pre-existing out of facility DNR order (yellow form or pink MOST form)     "MOST" Form in Place?       Family Communication: Daughter at the bedside Disposition Plan: Home once potassium improved  Consultants:  Nephrology  Time spent: 25 minutes  Alford Highland  [employer]

## 2015-11-04 NOTE — Progress Notes (Signed)
VS trending to normal.  Continues to void without difficulty.  Chronic pain relieved with prn meds.

## 2015-11-04 NOTE — Progress Notes (Signed)
Subjective:   Patient is doing fair. Asking about going home today However, her potassium level is critically high at 6.5 this morning Patient was given Kayexalate  Objective:  Vital signs in last 24 hours:  Temp:  [97.4 F (36.3 C)-98.1 F (36.7 C)] 97.9 F (36.6 C) (06/10 0750) Pulse Rate:  [65-75] 65 (06/10 0750) Resp:  [14-16] 14 (06/10 0750) BP: (115-146)/(37-58) 146/50 mmHg (06/10 0750) SpO2:  [95 %-98 %] 96 % (06/10 0750) Weight:  [86.42 kg (190 lb 8.3 oz)] 86.42 kg (190 lb 8.3 oz) (06/10 0415)  Weight change: -1.32 kg (-2 lb 14.6 oz) Filed Weights   11/02/15 0656 11/03/15 0430 11/04/15 0415  Weight: 89.585 kg (197 lb 8 oz) 87.74 kg (193 lb 6.9 oz) 86.42 kg (190 lb 8.3 oz)    Intake/Output:    Intake/Output Summary (Last 24 hours) at 11/04/15 1118 Last data filed at 11/04/15 1012  Gross per 24 hour  Intake    533 ml  Output      0 ml  Net    533 ml     Physical Exam: General: No acute distress, sitting up in chair  HEENT Anicteric, moist oral mucous membranes, decreased hearing  Neck supple  Pulm/lungs Mild scattered rhonchi, otherwise clear  CVS/Heart Irregular, no rub or gallop  Abdomen:  Soft, distended, nontender  Extremities: 2+ pitting edema bilaterally  Neurologic: Alert, able to follow commands  Skin: Scattered ecchymosis          Basic Metabolic Panel:   Recent Labs Lab 11/01/15 0340 11/02/15 0327 11/03/15 0417 11/03/15 1506 11/04/15 0517  NA 139 138 138 136 136  K 5.7* 5.0 6.0* 6.0* 6.5*  CL 111 108 105 105 107  CO2 20* 21* 24 23 21*  GLUCOSE 238* 195* 183* 163* 152*  BUN 78* 80* 94* 96* 97*  CREATININE 2.09* 2.28* 2.42* 2.33* 2.17*  CALCIUM 8.9 8.9 9.1 9.1 8.9     CBC:  Recent Labs Lab 10/31/15 1935 11/01/15 0340 11/04/15 0517  WBC 8.0 10.1 8.2  HGB 10.9* 11.5* 10.8*  HCT 34.4* 36.5 34.3*  MCV 84.6 82.7 84.2  PLT 225 255 259      Microbiology:  No results found for this or any previous visit (from the past  720 hour(s)).  Coagulation Studies: No results for input(s): LABPROT, INR in the last 72 hours.  Urinalysis: No results for input(s): COLORURINE, LABSPEC, PHURINE, GLUCOSEU, HGBUR, BILIRUBINUR, KETONESUR, PROTEINUR, UROBILINOGEN, NITRITE, LEUKOCYTESUR in the last 72 hours.  Invalid input(s): APPERANCEUR    Imaging: US Renal  11/03/2015  CLINICAL DATA:  Acute renal failure EXAM: RENAL / URINARY TRACT ULTRASOUND COMPLETE COMPARISON:  None. FINDINGS: Right Kidney: Length: 8.5 cm. Echogenicity within normal limits. No mass or hydronephrosis visualized. Cortical thinning. Left Kidney: Length: 8.4 cm. Echogenicity within normal limits. No mass or hydronephrosis visualized. Cortical thinning. Bladder: Appears normal for degree of bladder distention. IMPRESSION: Bilateral cortical atrophy, symmetric Electronically Signed   By: Esperanza Heir M.D.   On: 11/03/2015 16:10     Medications:     . amLODipine  5 mg Oral Daily  . furosemide  40 mg Oral BID  . gabapentin  300 mg Oral BID  . heparin  5,000 Units Subcutaneous Q8H  . insulin aspart  32 Units Subcutaneous TID WC  . insulin detemir  72 Units Subcutaneous QHS  . metoprolol  50 mg Oral BID  . ofloxacin  5 drop Left Ear BID  . patiromer  8.4 g Oral  Daily  . sodium chloride flush  3 mL Intravenous Q12H   acetaminophen **OR** acetaminophen, albuterol, hydrALAZINE, loperamide, ondansetron **OR** ondansetron (ZOFRAN) IV, oxyCODONE-acetaminophen  Assessment/ Plan:  82 y.o. female with a PMHX of Diabetes type 2, chronic kidney disease, hyperlipidemia, hypertension, gout, morbid obesity, grade 1 diastolic dysfunction, who was admitted to [facility] on 10/31/2015 for evaluation of shortness of breath.  She presented for worsening leg edema, shortness of breath and orthopnea  1. Acute renal failure on chronic kidney disease stage IV Baseline creatinine 2.1/GFR 21. Likely diabetic nephropathy and atherosclerotic renal disease urine protein/creatinine  ratio  0.19 Acute worsening of renal parameters seem to be related to aggressive diuresis in a short period of time Renal function is expected to improve in the next 1-2 days. Serum creatinine has started to improve  2. Hyperkalemia - Recommend low potassium diet - Trial of Veltassa And kayexalate  3. Lower extremity edema - Patient is unable to tolerate compression socks - blood pressure is slightly higher by decreasing the dose of amlodipine - we'll restart oral Lasix  4. Diabetes type 2 with CKD Requiring high dose of insulin Hemoglobin A1c 7.8%   LOS: 4 Zafar Debrosse 6/10/201711:18 AM

## 2015-11-04 NOTE — Progress Notes (Signed)
Patient received kayexalate this am.  Has had 2 BMs thus far.

## 2015-11-05 LAB — BASIC METABOLIC PANEL
ANION GAP: 8 (ref 5–15)
Anion gap: 10 (ref 5–15)
BUN: 96 mg/dL — AB (ref 6–20)
BUN: 98 mg/dL — AB (ref 6–20)
CALCIUM: 9.1 mg/dL (ref 8.9–10.3)
CALCIUM: 9.1 mg/dL (ref 8.9–10.3)
CO2: 23 mmol/L (ref 22–32)
CO2: 26 mmol/L (ref 22–32)
Chloride: 102 mmol/L (ref 101–111)
Chloride: 106 mmol/L (ref 101–111)
Creatinine, Ser: 1.95 mg/dL — ABNORMAL HIGH (ref 0.44–1.00)
Creatinine, Ser: 2.07 mg/dL — ABNORMAL HIGH (ref 0.44–1.00)
GFR calc Af Amer: 24 mL/min — ABNORMAL LOW (ref >60)
GFR calc Af Amer: 26 mL/min — ABNORMAL LOW (ref >60)
GFR, EST NON AFRICAN AMERICAN: 21 mL/min — AB (ref >60)
GFR, EST NON AFRICAN AMERICAN: 22 mL/min — AB (ref >60)
GLUCOSE: 241 mg/dL — AB (ref 65–99)
GLUCOSE: 199 mg/dL — AB (ref 65–99)
Potassium: 5.3 mmol/L — ABNORMAL HIGH (ref 3.5–5.1)
Potassium: 6.1 mmol/L — ABNORMAL HIGH (ref 3.5–5.1)
SODIUM: 139 mmol/L (ref 135–145)
Sodium: 136 mmol/L (ref 135–145)

## 2015-11-05 LAB — GLUCOSE, CAPILLARY
GLUCOSE-CAPILLARY: 250 mg/dL — AB (ref 65–99)
Glucose-Capillary: 201 mg/dL — ABNORMAL HIGH (ref 65–99)
Glucose-Capillary: 193 mg/dL — ABNORMAL HIGH (ref 65–99)
Glucose-Capillary: 278 mg/dL — ABNORMAL HIGH (ref 65–99)

## 2015-11-05 MED ORDER — AMLODIPINE BESYLATE 5 MG PO TABS: 5.0000 mg | ORAL_TABLET | Freq: Every day | ORAL | Status: AC

## 2015-11-05 MED ORDER — FUROSEMIDE 40 MG PO TABS: 40.0000 mg | ORAL_TABLET | Freq: Every day | ORAL | Status: AC

## 2015-11-05 MED ORDER — SODIUM POLYSTYRENE SULFONATE 15 GM/60ML PO SUSP: 30.0000 g | Freq: Once | ORAL | Status: AC

## 2015-11-05 MED ORDER — SODIUM POLYSTYRENE SULFONATE 15 GM/60ML PO SUSP: 30.0000 g | Freq: Once | ORAL | Status: DC

## 2015-11-05 NOTE — Discharge Summary (Signed)
Discharge summary [facility] [place] [facility]   PATIENT NAME: [name]    MR#:  [id number]  DATE OF BIRTH:  [birth year]  DATE OF ADMISSION:  10/31/2015 ADMITTING PHYSICIAN: Oralia Manis, MD  DATE OF DISCHARGE: 11/05/2015  1:00 PM  PRIMARY CARE PHYSICIAN: Hyman Hopes, MD    ADMISSION DIAGNOSIS:  Shortness of breath [R06.02]  DISCHARGE DIAGNOSIS:  Principal Problem:   Shortness of breath Active Problems:   Lower extremity edema   Diabetes (HCC)   Ear pain   Morbid obesity (HCC)   Chronic diastolic CHF (congestive heart failure) (HCC)   Accelerated hypertension   CKD (chronic kidney disease), stage III   Asthma   SECONDARY DIAGNOSIS:   Past Medical History  Diagnosis Date  . Diabetes mellitus, insulin dependent (IDDM), uncontrolled (HCC)   . CKD (chronic kidney disease), stage III   . HLD (hyperlipidemia)   . Essential hypertension   . Gout   . Morbid obesity (HCC)   . Chronic diastolic CHF (congestive heart failure) (HCC)   . Osteoarthritis   . Asthma     HOSPITAL COURSE:   1. Hyperkalemia. Likely a combination from lisinopril and acute kidney injury on chronic kidney disease. Patient was given Kayexalate during the hospital course. Patient was started on Patiromir by nephrology. This medication will need outpatient approval before he can get started. I will give another dose of Kayexalate as outpatient on Tuesday. Recommend following up this week with nephrology as outpatient. Potassium was as high as 6.5 during the hospital course. Upon discharge 5.3. Case discussed with nephrology prior to discharge home. Close clinical follow-up needed. 2. Acute kidney injury on chronic kidney disease stage IV. Creatinine maximum up to 2.42. Creatinine now down to 1.95 upon discharge home. 3. Lower extremity edema, acute diastolic congestive heart failure. Patient was given IV diuresis and creatinine worsened. Lasix was held and then restarted orally.  Norvasc dose was decreased. 4. Essential hypertension. Lisinopril stopped. Lasix dose increased. Norvasc dose decreased. 5. Type 2 diabetes mellitus on detemir insulin and aspart insulin prior to meals.   DISCHARGE CONDITIONS:   Satisfactory  CONSULTS OBTAINED:  Treatment Team:  Geanie Logan, MD Mosetta Pigeon, MD  DRUG ALLERGIES:   Allergies  Allergen Reactions  . Amitriptyline Nausea And Vomiting  . Influenza Vaccines Other (See Comments)    Reaction:  Unknown   . Penicillins Itching and Other (See Comments)    Has patient had a PCN reaction causing immediate rash, facial/tongue/throat swelling, SOB or lightheadedness with hypotension: No Has patient had a PCN reaction causing severe rash involving mucus membranes or skin necrosis: No Has patient had a PCN reaction that required hospitalization No Has patient had a PCN reaction occurring within the last 10 years: No If all of the above answers are "NO", then may proceed with Cephalosporin use.  . Pneumococcal Vaccines Other (See Comments)    Reaction:  Unknown   . Rosiglitazone Other (See Comments)    Reaction:  Unknown   . Sulfa Antibiotics Itching    DISCHARGE MEDICATIONS:   Discharge Medication List as of 11/05/2015 11:50 AM    START taking these medications   Details  loperamide (IMODIUM) 2 MG capsule Take 1 capsule (2 mg total) by mouth as needed for diarrhea or loose stools., Starting 11/02/2015, Until Discontinued, Print    ofloxacin (OCUFLOX) 0.3 % ophthalmic solution Place 5 drops into the left ear 2 (two) times daily., Starting 11/02/2015, Until Discontinued, Print  sodium polystyrene (KAYEXALATE) 15 GM/60ML suspension Take 120 mLs (30 g total) by mouth once. Take one time on Tuesday, Starting 11/07/2015, Print      CONTINUE these medications which have CHANGED   Details  amLODipine (NORVASC) 5 MG tablet Take 1 tablet (5 mg total) by mouth daily., Starting 11/05/2015, Until Discontinued, Print    furosemide  (LASIX) 40 MG tablet Take 1 tablet (40 mg total) by mouth daily., Starting 11/05/2015, Until Discontinued, Print      CONTINUE these medications which have NOT CHANGED   Details  albuterol (PROVENTIL HFA;VENTOLIN HFA) 108 (90 Base) MCG/ACT inhaler Inhale 2 puffs into the lungs every 4 (four) hours as needed for wheezing or shortness of breath., Until Discontinued, Historical Med    atorvastatin (LIPITOR) 20 MG tablet Take 20 mg by mouth at bedtime., Until Discontinued, Historical Med    diphenoxylate-atropine (LOMOTIL) 2.5-0.025 MG tablet Take 1-2 tablets by mouth 4 (four) times daily as needed for diarrhea or loose stools., Until Discontinued, Historical Med    gabapentin (NEURONTIN) 300 MG capsule Take 300 mg by mouth 3 (three) times daily., Until Discontinued, Historical Med    insulin aspart (NOVOLOG) 100 UNIT/ML injection Inject 32 Units into the skin 3 (three) times daily before meals. , Until Discontinued, Historical Med    insulin detemir (LEVEMIR) 100 UNIT/ML injection Inject 72 Units into the skin at bedtime., Until Discontinued, Historical Med    metoprolol (LOPRESSOR) 50 MG tablet Take 50 mg by mouth 2 (two) times daily., Until Discontinued, Historical Med    oxyCODONE-acetaminophen (PERCOCET/ROXICET) 5-325 MG tablet Take 1 tablet by mouth every 6 (six) hours as needed for severe pain. , Until Discontinued, Historical Med      STOP taking these medications     lisinopril (PRINIVIL,ZESTRIL) 40 MG tablet          DISCHARGE INSTRUCTIONS:   Follow-up nephrology this week. Follow-up PMD 2 weeks  If you experience worsening of your admission symptoms, develop shortness of breath, life threatening emergency, suicidal or homicidal thoughts you must seek medical attention immediately by calling 911 or calling your MD immediately  if symptoms less severe.  You Must read complete instructions/literature along with all the possible adverse reactions/side effects for all the  Medicines you take and that have been prescribed to you. Take any new Medicines after you have completely understood and accept all the possible adverse reactions/side effects.   Please note  You were cared for by a hospitalist during your hospital stay. If you have any questions about your discharge medications or the care you received while you were in the hospital after you are discharged, you can call the unit and asked to speak with the hospitalist on call if the hospitalist that took care of you is not available. Once you are discharged, your primary care physician will handle any further medical issues. Please note that NO REFILLS for any discharge medications will be authorized once you are discharged, as it is imperative that you return to your primary care physician (or establish a relationship with a primary care physician if you do not have one) for your aftercare needs so that they can reassess your need for medications and monitor your lab values.    Today   CHIEF COMPLAINT:   Chief Complaint  Patient presents with  . Shortness of Breath    HISTORY OF PRESENT ILLNESS:  [name]  is a 82 y.o. female  Presented with shortness of breath  VITAL SIGNS:  Blood pressure 123/56, pulse 81, temperature 98.2 F (36.8 C), temperature source Oral, resp. rate 14, height 4\' 11"  (1.499 m), weight 85.73 kg (189 lb), SpO2 96 %.    PHYSICAL EXAMINATION:  GENERAL:  82 y.o.-year-old patient lying in the bed with no acute distress.  EYES: Pupils equal, round, reactive to light and accommodation. No scleral icterus. Extraocular muscles intact.  HEENT: Head atraumatic, normocephalic. Oropharynx and nasopharynx clear.  NECK:  Supple, no jugular venous distention. No thyroid enlargement, no tenderness.  LUNGS: Normal breath sounds bilaterally, no wheezing, rales,rhonchi or crepitation. No use of accessory muscles of respiration.  CARDIOVASCULAR: S1, S2 normal. No murmurs, rubs, or gallops.   ABDOMEN: Soft, non-tender, non-distended. Bowel sounds present. No organomegaly or mass.  EXTREMITIES: 2+ pedal edema. No cyanosis or clubbing.  NEUROLOGIC: Cranial nerves II through XII are intact. Muscle strength 5/5 in all extremities. Sensation intact. Gait not checked.  PSYCHIATRIC: The patient is alert and oriented x 3.  SKIN: No obvious rash, lesion, or ulcer.   DATA REVIEW:   CBC  Recent Labs Lab 11/04/15 0517  WBC 8.2  HGB 10.8*  HCT 34.3*  PLT 259    Chemistries   Recent Labs Lab 11/04/15 0517  11/05/15 0528  NA 136  < > 139  K 6.5*  < > 5.3*  CL 107  < > 106  CO2 21*  < > 23  GLUCOSE 152*  < > 199*  BUN 97*  < > 98*  CREATININE 2.17*  < > 1.95*  CALCIUM 8.9  < > 9.1  AST 27  --   --   ALT 23  --   --   ALKPHOS 63  --   --   BILITOT 0.2*  --   --   < > = values in this interval not displayed.  Cardiac Enzymes  Recent Labs Lab 11/01/15 2245  TROPONINI 0.04*    Microbiology Results  Results for orders placed or performed in visit on 07/26/11  Wound culture     Status: None   Collection Time: 07/31/11 10:59 AM  Result Value Ref Range Status   Micro Text Report   Final       SOURCE: ??    COMMENT                   NO GROWTH AEROBICALLY/ANAEROBICALLY IN 4 DAYS   GRAM STAIN                NO WHITE BLOOD CELLS   GRAM STAIN                NO ORGANISMS SEEN   ANTIBIOTIC                                                        RADIOLOGY:  09/30/11 Renal  11/03/2015  CLINICAL DATA:  Acute renal failure EXAM: RENAL / URINARY TRACT ULTRASOUND COMPLETE COMPARISON:  None. FINDINGS: Right Kidney: Length: 8.5 cm. Echogenicity within normal limits. No mass or hydronephrosis visualized. Cortical thinning. Left Kidney: Length: 8.4 cm. Echogenicity within normal limits. No mass or hydronephrosis visualized. Cortical thinning. Bladder: Appears normal for degree of bladder distention. IMPRESSION: Bilateral cortical atrophy, symmetric Electronically Signed   By: 01/03/2016 M.D.   On: 11/03/2015 16:10    Management  plans discussed with the patient, family and they are in agreement.  CODE STATUS:     Code Status Orders        Start     Ordered   10/31/15 2347  Full code   Continuous     10/31/15 2347    Code Status History    Date Active Date Inactive Code Status Order ID Comments User Context   This patient has a current code status but no historical code status.    Advance Directive Documentation        Most Recent Value   Type of Advance Directive  Healthcare Power of Attorney [son John]   Pre-existing out of facility DNR order (yellow form or pink MOST form)     "MOST" Form in Place?        TOTAL TIME TAKING CARE OF THIS PATIENT: 35 minutes.    Alford Highland M.D on 11/05/2015 at 2:00 PM  Between 7am to 6pm - Pager - [phone]  After 6pm go to [email or web address] - password [email or web address]  [facility] Office  [phone]  CC: Primary care physician; Hyman Hopes, MD

## 2015-11-05 NOTE — Care Management Note (Addendum)
Case Management Note  Patient Details  Name: [name] MRN: [id number] Date of Birth: 19-Nov-1928  Subjective/Objective:      Discussed discharge planning and provided [name] and her daughter [name] with a list of home health providers. After [name] heard that the home health PT, RN, and Aid was was ordered for [name] will not be permanent, she declined an offer of home health services. [name] reports that she and her sister can look after [name] and assist her to walk with her front wheeled walker at home. [name] accepted a list of sitters and home aids on a list of providers who provide self-pay services for as long as the customer wants services. No home health referrals made per daughter and [name] request. [name] has a front wheel walker at home.               Action/Plan:   Expected Discharge Date:                  Expected Discharge Plan:     In-House Referral:     Discharge planning Services     Post Acute Care Choice:    Choice offered to:     DME Arranged:    DME Agency:     HH Arranged:    HH Agency:     Status of Service:     Medicare Important Message Given:  Yes Date Medicare IM Given:    Medicare IM give by:    Date Additional Medicare IM Given:    Additional Medicare Important Message give by:     If discussed at Long Length of Stay Meetings, dates discussed:    Additional Comments:  Michall Noffke A, RN 11/05/2015, 10:15 AM

## 2015-11-05 NOTE — Discharge Instructions (Signed)
Stop Lisinopril Decrease dose of amlodipine (norvasc) to 5mg  orally daily Increase Lasix dose to 40mg  po daily One time Kayexalate dose on Tuesday Acute Kidney Injury Acute kidney injury is any condition in which there is sudden (acute) damage to the kidneys. Acute kidney injury was previously known as acute kidney failure or acute renal failure. The kidneys are two organs that lie on either side of the spine between the middle of the back and the front of the abdomen. The kidneys:  Remove wastes and extra water from the blood.   Produce important hormones. These help keep bones strong, regulate blood pressure, and help create red blood cells.   Balance the fluids and chemicals in the blood and tissues. A small amount of kidney damage may not cause problems, but a large amount of damage may make it difficult or impossible for the kidneys to work the way they should. Acute kidney injury may develop into long-lasting (chronic) kidney disease. It may also develop into a life-threatening disease called end-stage kidney disease. Acute kidney injury can get worse very quickly, so it should be treated right away. Early treatment may prevent other kidney diseases from developing. CAUSES   A problem with blood flow to the kidneys. This may be caused by:   Blood loss.   Heart disease.   Severe burns.   Liver disease.  Direct damage to the kidneys. This may be caused by:  Some medicines.   A kidney infection.   Poisoning or consuming toxic substances.   A surgical wound.   A blow to the kidney area.   A problem with urine flow. This may be caused by:   Cancer.   Kidney stones.   An enlarged prostate. SIGNS AND SYMPTOMS   Swelling (edema) of the legs, ankles, or feet.   Tiredness (lethargy).   Nausea or vomiting.   Confusion.   Problems with urination, such as:   Painful or burning feeling during urination.   Decreased urine production.   Frequent  accidents in children who are potty trained.   Bloody urine.   Muscle twitches and cramps.   Shortness of breath.   Seizures.   Chest pain or pressure. Sometimes, no symptoms are present. DIAGNOSIS Acute kidney injury may be detected and diagnosed by tests, including blood, urine, imaging, or kidney biopsy tests.  TREATMENT Treatment of acute kidney injury varies depending on the cause and severity of the kidney damage. In mild cases, no treatment may be needed. The kidneys may heal on their own. If acute kidney injury is more severe, your health care provider will treat the cause of the kidney damage, help the kidneys heal, and prevent complications from occurring. Severe cases may require a procedure to remove toxic wastes from the body (dialysis) or surgery to repair kidney damage. Surgery may involve:   Repair of a torn kidney.   Removal of an obstruction. HOME CARE INSTRUCTIONS  Follow your prescribed diet.  Take medicines only as directed by your health care provider.  Do not take any new medicines (prescription, over-the-counter, or nutritional supplements) unless approved by your health care provider. Many medicines can worsen your kidney damage or may need to have the dose adjusted.   Keep all follow-up visits as directed by your health care provider. This is important.  Observe your condition to make sure you are healing as expected. SEEK IMMEDIATE MEDICAL CARE IF:  You are feeling ill or have severe pain in the back or side.   Your  symptoms return or you have new symptoms.  You have any symptoms of end-stage kidney disease. These include:   Persistent itchiness.   Loss of appetite.   Headaches.   Abnormally dark or light skin.  Numbness in the hands or feet.   Easy bruising.   Frequent hiccups.   Menstruation stops.   You have a fever.  You have increased urine production.  You have pain or bleeding when urinating. MAKE SURE  YOU:   Understand these instructions.  Will watch your condition.  Will get help right away if you are not doing well or get worse.   This information is not intended to replace advice given to you by your health care provider. Make sure you discuss any questions you have with your health care provider.   Document Released: 11/26/2010 Document Revised: 06/03/2014 Document Reviewed: 01/10/2012 Elsevier Interactive Patient Education 2016 [employer]  Cerebrospinal Fluid Analysis WHY AM I HAVING THIS TEST? Cerebrospinal fluid (CSF) analysis is a group of lab tests that are done on a sample of the fluid that surrounds and protects your brain and spinal cord. You may have a CSF analysis if your health care provider thinks you may have:  An infection of your brain (encephalitis) or surrounding membranes (meningitis).  Bleeding around your brain (subarachnoid hemorrhage).  Certaindiseases that affect your brain and spinal cord, such as multiple sclerosis and Guillain-Barre syndrome.  A tumor on your brain or spinal cord. Tumors may start in your brain (primary tumor). They may also spread from your blood or other areas of your body (metastatic tumor). WHAT KIND OF SAMPLE IS TAKEN? CSF is usually collected from a lumbar puncture, also known as a spinal tap. This procedure involves inserting a long, thin needle through the skin and between the backbones (vertebrae) of your lower back until the needle reaches the CSF space. HOW DO I PREPARE FOR THE TEST?  Before the spinal tap, you will be asked to:  Urinate.  Have a bowel movement.  Your health care provider may do a neurological exam of your legs before the spinal tap.  Be aware that you will need to lie still while having the spinal tap. HOW ARE MY RESULTS REPORTED? Your results may be reported in different ways and are dependent upon the kind of test performed. It is most common for your results to be reported as:  Reference  ranges, values, and intervals.  Positive or negative.  Descriptions based on appearance.  Descriptions based on smell.  Descriptions of whether something is found or not found in the sample. It is your responsibility to obtain your test results. Ask the lab or department performing the test when and how you will get your results. WHAT DO THE RESULTS MEAN? This test can produce many different results. Therefore, it is important that you talk with your health care provider about your results, treatment options, and if necessary, the need for more tests. Talk with your health care provider if you have any questions about your results.   This information is not intended to replace advice given to you by your health care provider. Make sure you discuss any questions you have with your health care provider.   Document Released: 06/15/2004 Document Revised: 06/03/2014 Document Reviewed: 12/30/2012 Elsevier Interactive Patient Education [employer]

## 2015-11-05 NOTE — Progress Notes (Signed)
Patient discharged with daughter.  Belongings returned.  Education provided regarding Kidney disease, hyperkalemia, and the new medication amlodipine. Patients Potassium today is down to 5.3.

## 2015-11-05 NOTE — Progress Notes (Signed)
Subjective:   Patient is doing fair. Asking about going home today Potassium level has now improved    Objective:  Vital signs in last 24 hours:  Temp:  [98.2 F (36.8 C)-98.4 F (36.9 C)] 98.2 F (36.8 C) (06/11 0731) Pulse Rate:  [74-81] 81 (06/11 0731) Resp:  [14-17] 14 (06/11 0731) BP: (123-143)/(44-107) 123/56 mmHg (06/11 0731) SpO2:  [95 %-96 %] 96 % (06/11 0731) Weight:  [85.73 kg (189 lb)] 85.73 kg (189 lb) (06/11 0500)  Weight change: -0.69 kg (-1 lb 8.3 oz) Filed Weights   11/03/15 0430 11/04/15 0415 11/05/15 0500  Weight: 87.74 kg (193 lb 6.9 oz) 86.42 kg (190 lb 8.3 oz) 85.73 kg (189 lb)    Intake/Output:    Intake/Output Summary (Last 24 hours) at 11/05/15 1212 Last data filed at 11/05/15 0929  Gross per 24 hour  Intake    240 ml  Output      0 ml  Net    240 ml     Physical Exam: General: No acute distress, sitting up in chair  HEENT Anicteric, moist oral mucous membranes, decreased hearing  Neck supple  Pulm/lungs Mild scattered rhonchi, otherwise clear  CVS/Heart Irregular, no rub or gallop  Abdomen:  Soft, distended, nontender  Extremities: 1+ pitting edema bilaterally  Neurologic: Alert, able to follow commands  Skin: Scattered ecchymosis          Basic Metabolic Panel:   Recent Labs Lab 11/03/15 1506 11/04/15 0517 11/04/15 1301 11/04/15 2347 11/05/15 0528  NA 136 136 134* 136 139  K 6.0* 6.5* 5.6* 6.1* 5.3*  CL 105 107 101 102 106  CO2 23 21* 23 26 23   GLUCOSE 163* 152* 200* 241* 199*  BUN 96* 97* 99* 96* 98*  CREATININE 2.33* 2.17* 2.21* 2.07* 1.95*  CALCIUM 9.1 8.9 9.5 9.1 9.1     CBC:  Recent Labs Lab 10/31/15 1935 11/01/15 0340 11/04/15 0517  WBC 8.0 10.1 8.2  HGB 10.9* 11.5* 10.8*  HCT 34.4* 36.5 34.3*  MCV 84.6 82.7 84.2  PLT 225 255 259      Microbiology:  No results found for this or any previous visit (from the past 720 hour(s)).  Coagulation Studies: No results for input(s): LABPROT, INR in the  last 72 hours.  Urinalysis: No results for input(s): COLORURINE, LABSPEC, PHURINE, GLUCOSEU, HGBUR, BILIRUBINUR, KETONESUR, PROTEINUR, UROBILINOGEN, NITRITE, LEUKOCYTESUR in the last 72 hours.  Invalid input(s): APPERANCEUR    Imaging: 01/04/16 Renal  11/03/2015  CLINICAL DATA:  Acute renal failure EXAM: RENAL / URINARY TRACT ULTRASOUND COMPLETE COMPARISON:  None. FINDINGS: Right Kidney: Length: 8.5 cm. Echogenicity within normal limits. No mass or hydronephrosis visualized. Cortical thinning. Left Kidney: Length: 8.4 cm. Echogenicity within normal limits. No mass or hydronephrosis visualized. Cortical thinning. Bladder: Appears normal for degree of bladder distention. IMPRESSION: Bilateral cortical atrophy, symmetric Electronically Signed   By: 01/03/2016 M.D.   On: 11/03/2015 16:10     Medications:     . amLODipine  5 mg Oral Daily  . furosemide  40 mg Oral BID  . gabapentin  300 mg Oral BID  . heparin  5,000 Units Subcutaneous Q8H  . insulin aspart  32 Units Subcutaneous TID WC  . insulin detemir  72 Units Subcutaneous QHS  . metoprolol  50 mg Oral BID  . ofloxacin  5 drop Left Ear BID  . patiromer  8.4 g Oral Daily  . sodium chloride flush  3 mL Intravenous Q12H  . [START  ON 11/07/2015] sodium polystyrene  30 g Oral Once   acetaminophen **OR** acetaminophen, albuterol, hydrALAZINE, loperamide, ondansetron **OR** ondansetron (ZOFRAN) IV, oxyCODONE-acetaminophen  Assessment/ Plan:  82 y.o. female with a PMHX of Diabetes type 2, chronic kidney disease, hyperlipidemia, hypertension, gout, morbid obesity, grade 1 diastolic dysfunction, who was admitted to [facility] on 10/31/2015 for evaluation of shortness of breath.  She presented for worsening leg edema, shortness of breath and orthopnea  1. Acute renal failure on chronic kidney disease stage IV Creatinine at baseline 1.9/GFR 22. Likely diabetic nephropathy and atherosclerotic renal disease urine protein/creatinine ratio  0.19  2.  Hyperkalemia - Recommend low potassium diet -   Kayexalate x 1 outpatient  3. Lower extremity edema - Patient is unable to tolerate compression socks - blood pressure is slightly higher by decreasing the dose of amlodipine - continue oral Lasix.  - f/u outpatient  4. Diabetes type 2 with CKD Requiring high dose of insulin Hemoglobin A1c 7.8%   LOS: 5 Montey Ebel 6/11/201712:12 PM

## 2016-06-06 ENCOUNTER — Other Ambulatory Visit: Payer: Self-pay | Admitting: Internal Medicine

## 2016-06-06 DIAGNOSIS — R1901 Right upper quadrant abdominal swelling, mass and lump: Secondary | ICD-10-CM

## 2016-06-06 DIAGNOSIS — N950 Postmenopausal bleeding: Secondary | ICD-10-CM

## 2016-06-11 ENCOUNTER — Ambulatory Visit
Admission: RE | Admit: 2016-06-11 | Discharge: 2016-06-11 | Disposition: A | Discharge status: Home / Self Care | Payer: Medicare Other | Source: Ambulatory Visit | Attending: Internal Medicine | Admitting: Internal Medicine

## 2016-06-11 DIAGNOSIS — I700 Atherosclerosis of aorta: Secondary | ICD-10-CM | POA: Insufficient documentation

## 2016-06-11 DIAGNOSIS — N261 Atrophy of kidney (terminal): Secondary | ICD-10-CM | POA: Diagnosis not present

## 2016-06-11 DIAGNOSIS — K760 Fatty (change of) liver, not elsewhere classified: Secondary | ICD-10-CM | POA: Insufficient documentation

## 2016-06-11 DIAGNOSIS — R1901 Right upper quadrant abdominal swelling, mass and lump: Secondary | ICD-10-CM | POA: Diagnosis not present

## 2016-06-11 DIAGNOSIS — N950 Postmenopausal bleeding: Secondary | ICD-10-CM

## 2017-04-20 IMAGING — US US PELVIS COMPLETE
1 series · 14 of 25 positions shown · non-contrast
Comparison: None.

CLINICAL DATA: Postmenopausal bleeding

EXAM:
TRANSABDOMINAL ULTRASOUND OF PELVIS
TECHNIQUE: Transabdominal ultrasound examination of the pelvis was performed
including evaluation of the uterus, ovaries, adnexal regions, and
pelvic cul-de-sac.

[Series 1: us pelvis complete · 0.27mm/px · 14 of 26 slices shown]
[im 1/26]
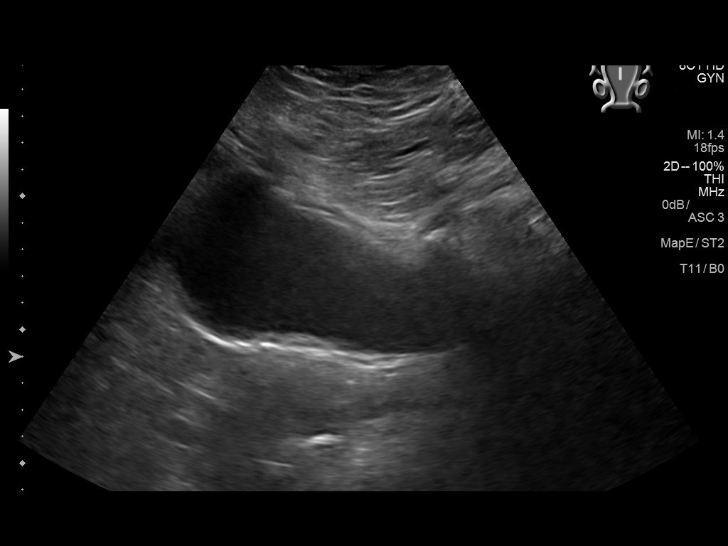
[im 3/26]
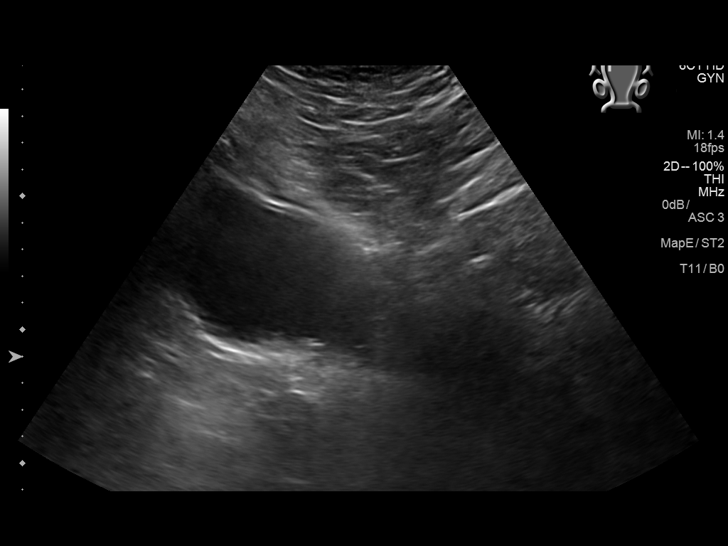
[im 5/26]
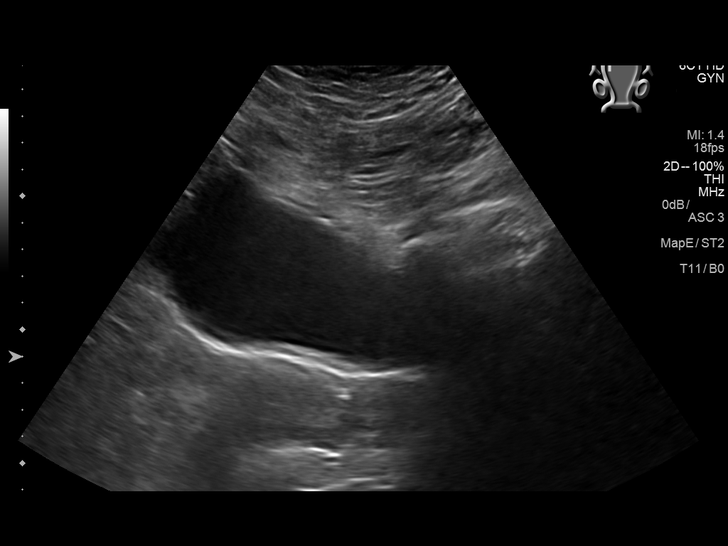
[im 7/26]
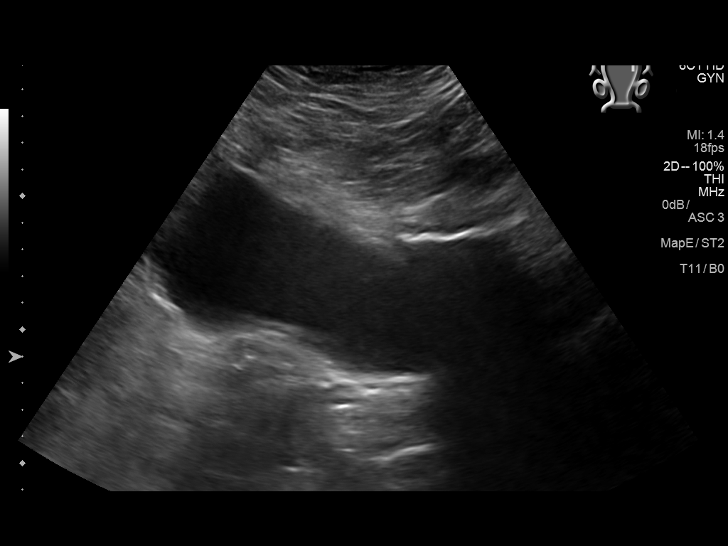
[im 9/26]
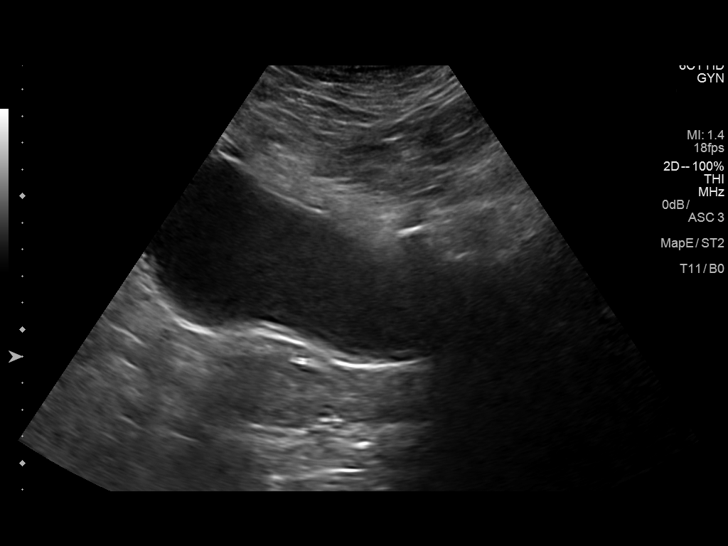
[im 10/26]
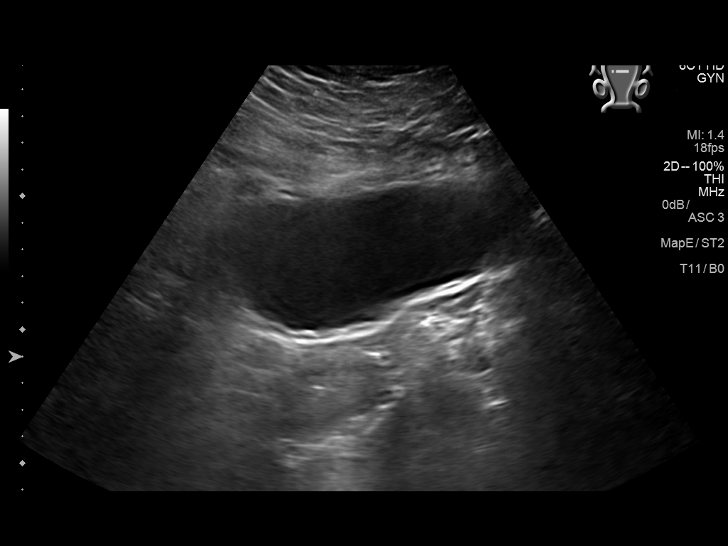
[im 12/26]
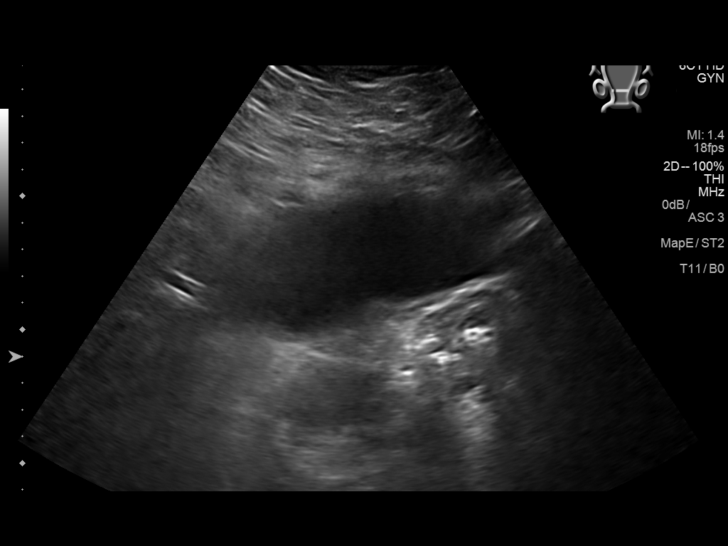
[im 14/26]
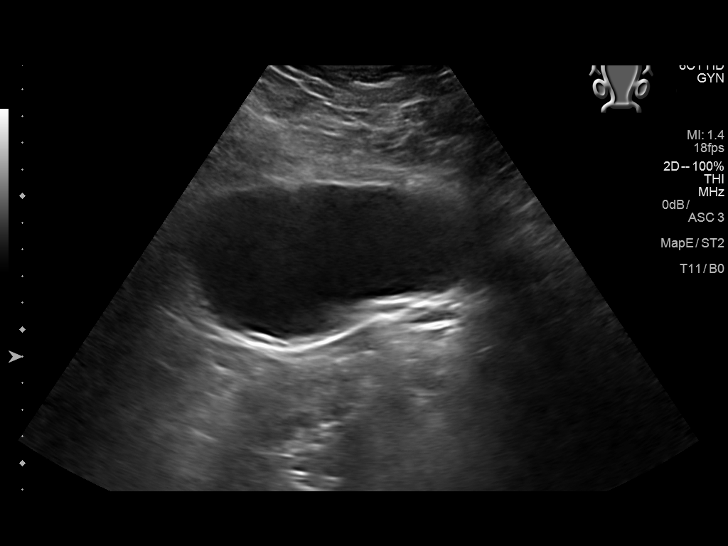
[im 16/26]
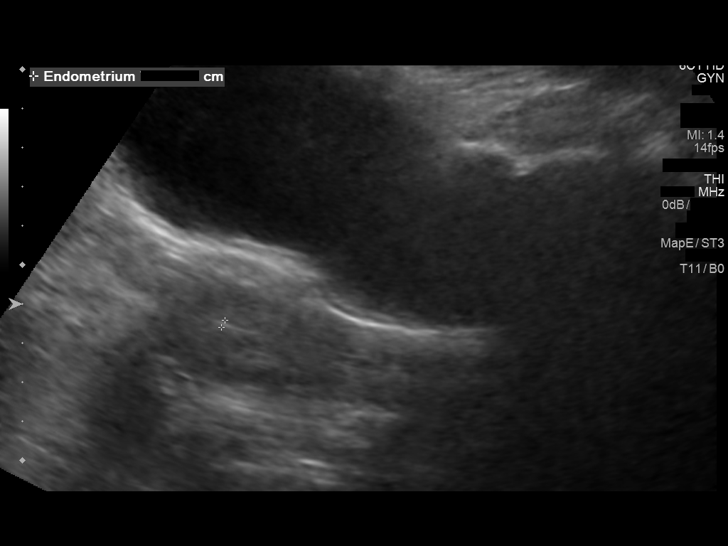
[im 17/26]
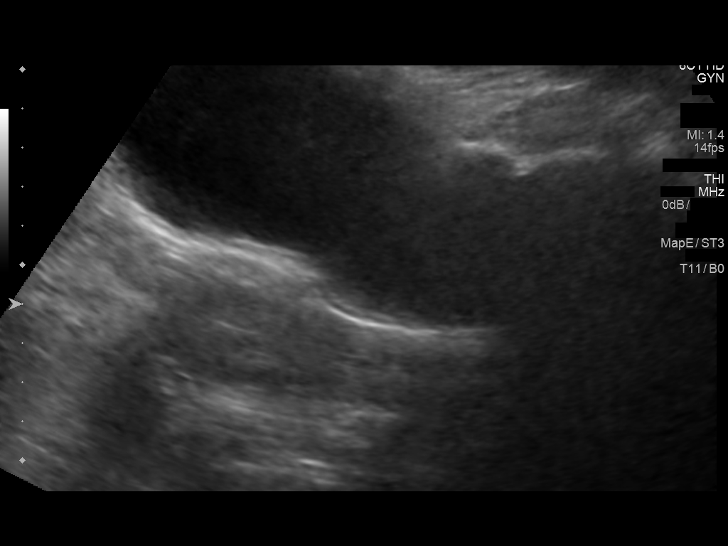
[im 19/26]
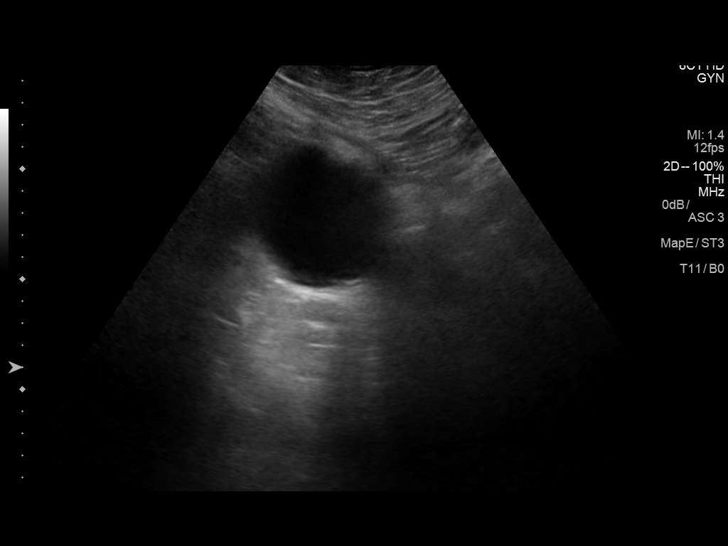
[im 21/26]
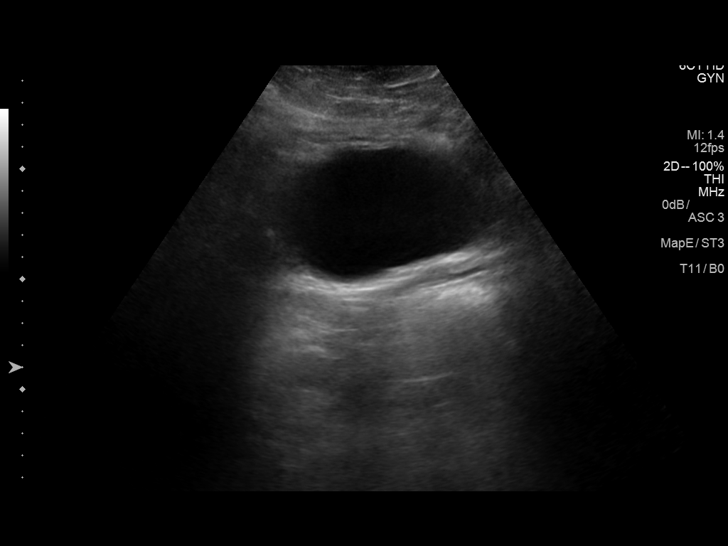
[im 23/26]
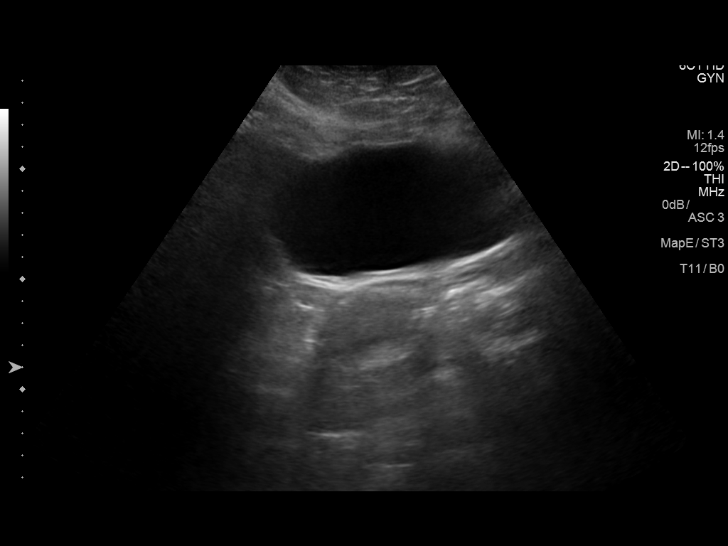
[im 26/26]
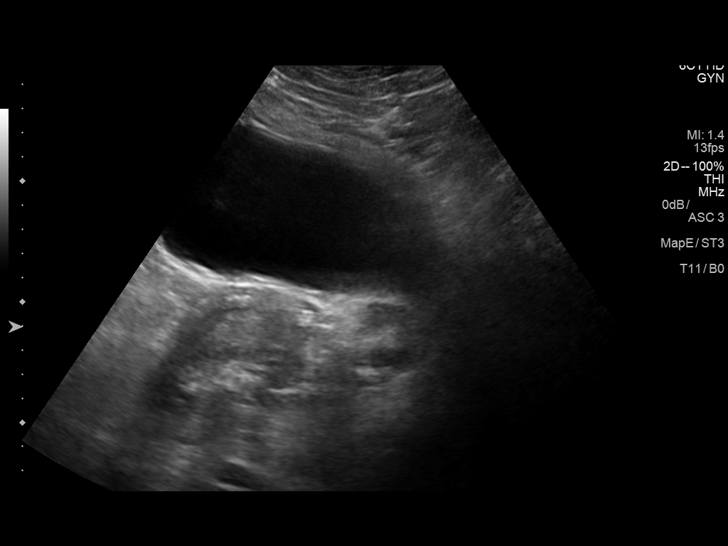

[14 of 25 positions shown; findings below may reference images not displayed]

FINDINGS: Uterus

Measurements: 8.3 x 3.7 x 4.2 cm. No fibroids or other mass
visualized.

Endometrium

Thickness: 2 mm.  No focal abnormality visualized.

Right ovary

Not visualized

Left ovary

Not visualized

Other findings: No abnormal free fluid. Per sonographer,
transvaginal study not performed due to patient inability to lay
flat
IMPRESSION: 1. Normal trans abdominal ultrasound appearance of the uterus
2. Nonvisualization of the bilateral ovaries

## 2017-04-20 IMAGING — US US ABDOMEN COMPLETE
1 series · 13 of 25 positions shown · non-contrast
Comparison: Renal ultrasound from 11/03/2015

CLINICAL DATA: Right upper quadrant abdominal mass.

EXAM:
ABDOMEN ULTRASOUND COMPLETE

[Series 1: us abdomen complete · 0.22mm/px · 13 of 86 slices shown]
[im 1/86]
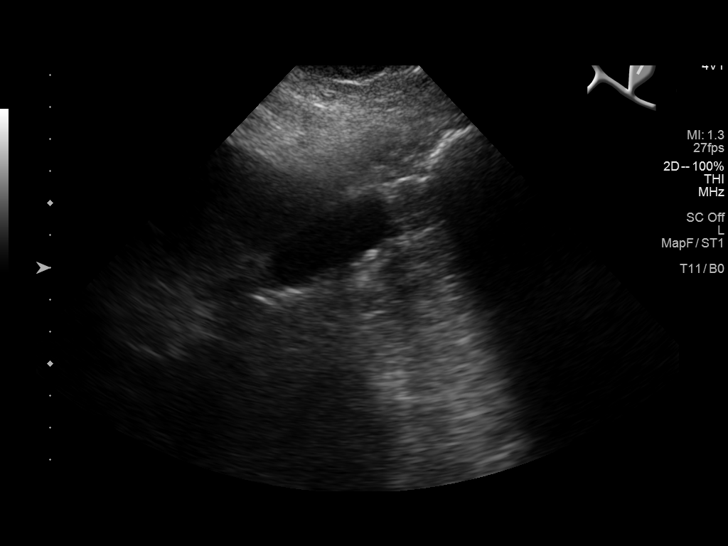
[im 8/86]
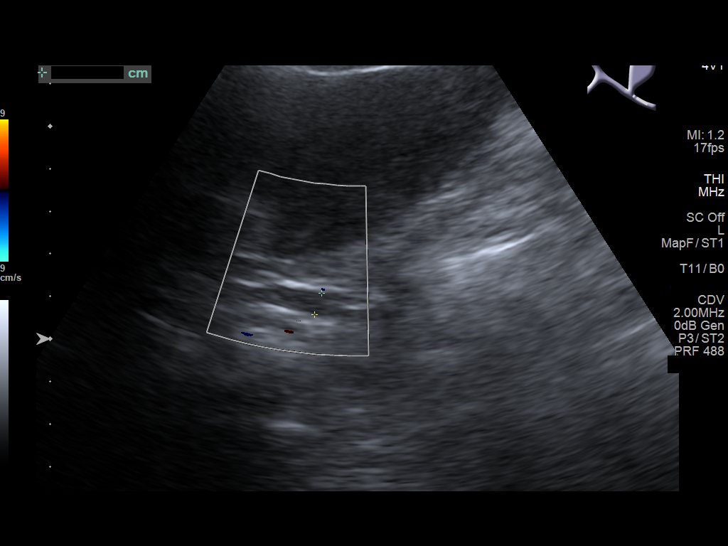
[im 15/86]
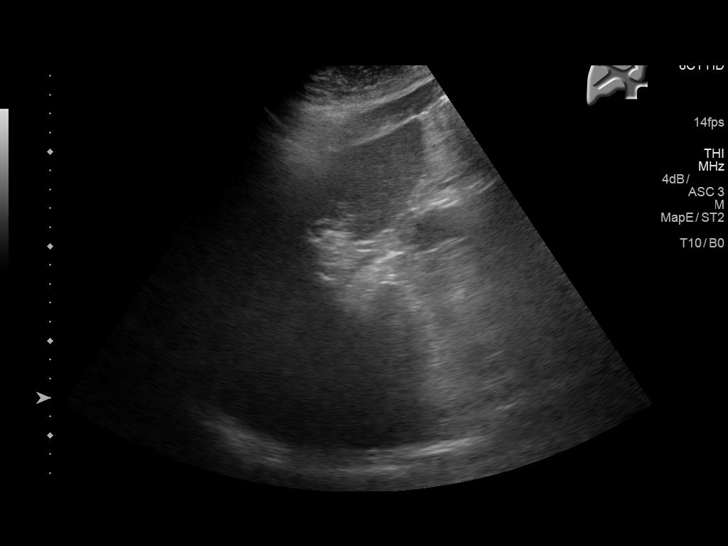
[im 22/86]
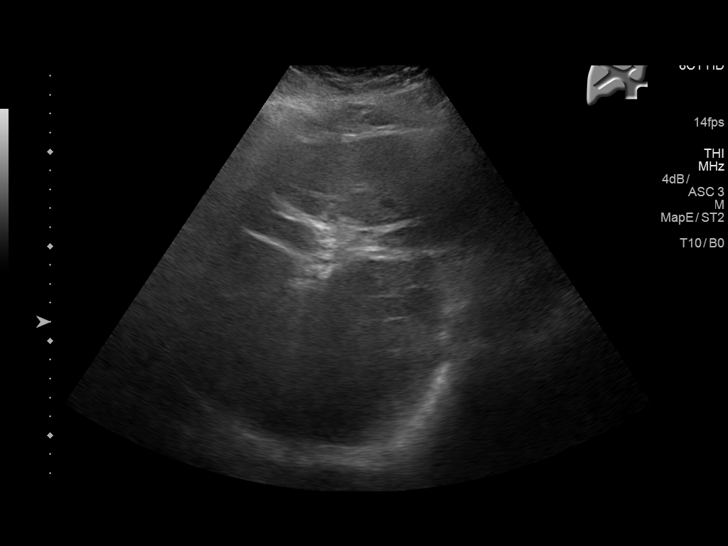
[im 29/86]
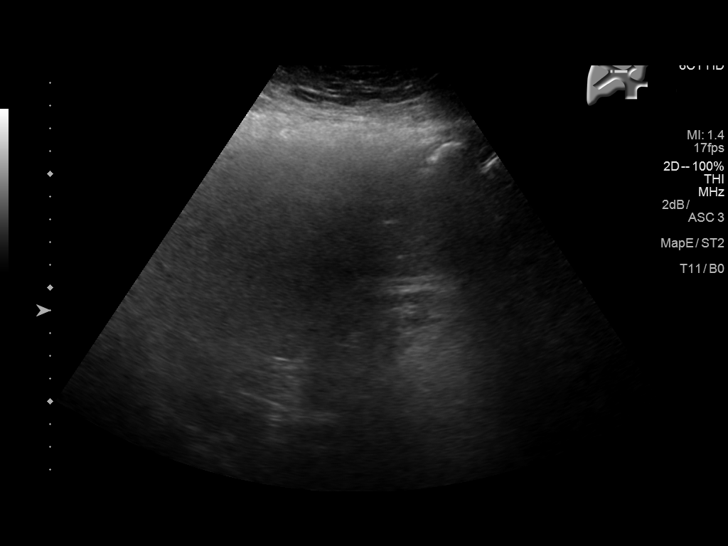
[im 36/86]
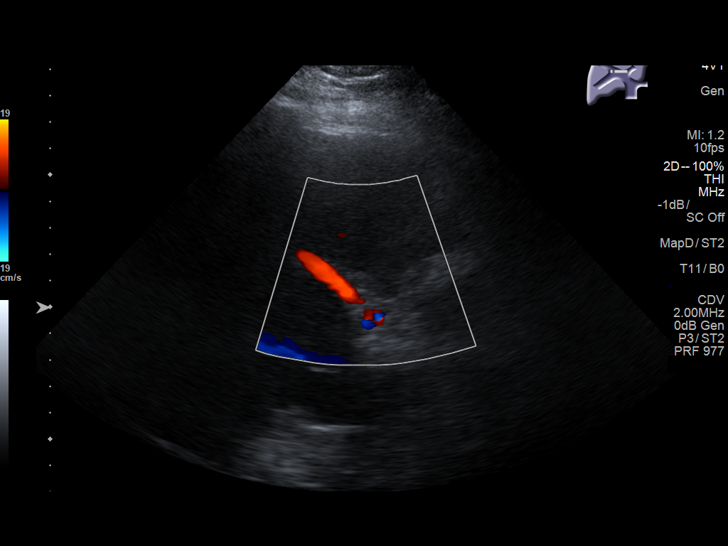
[im 43/86]
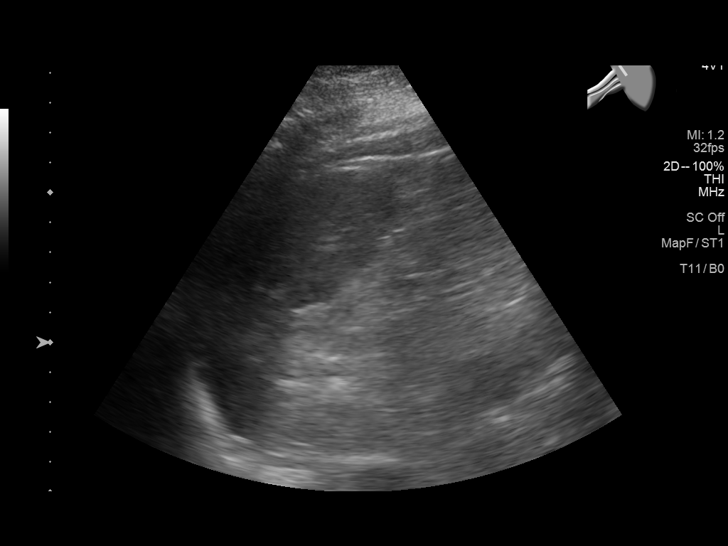
[im 50/86]
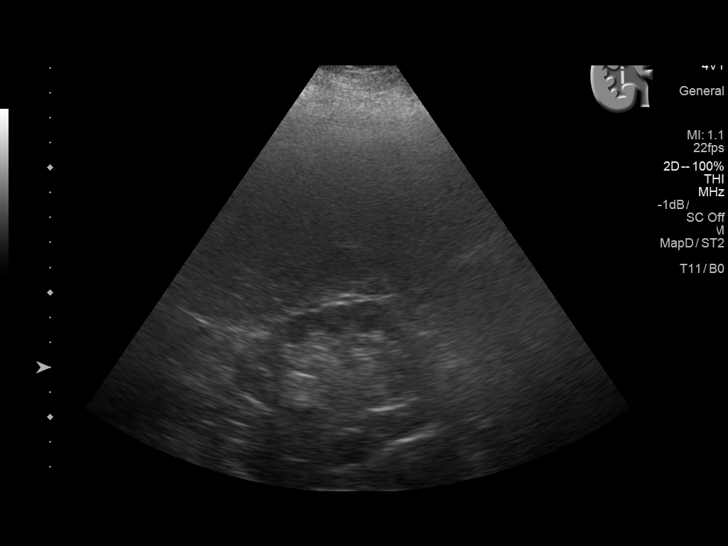
[im 57/86]
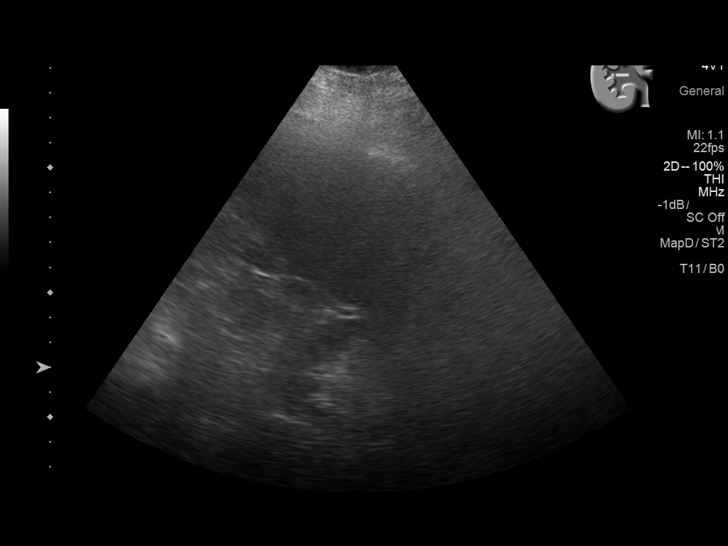
[im 64/86]
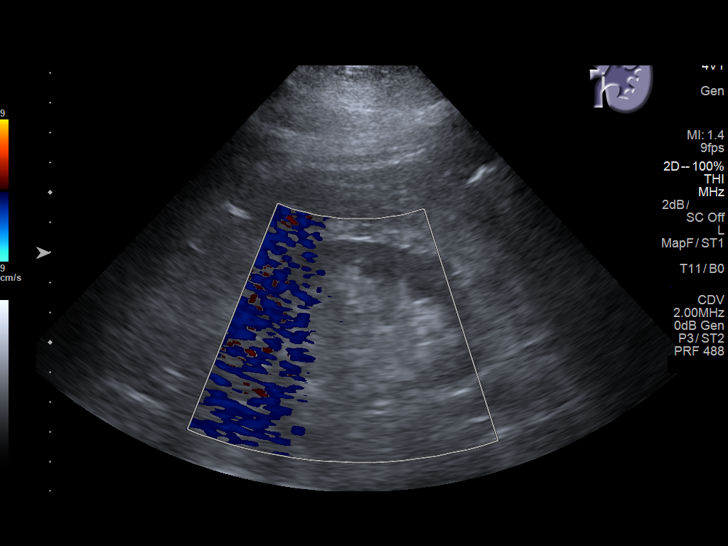
[im 71/86]
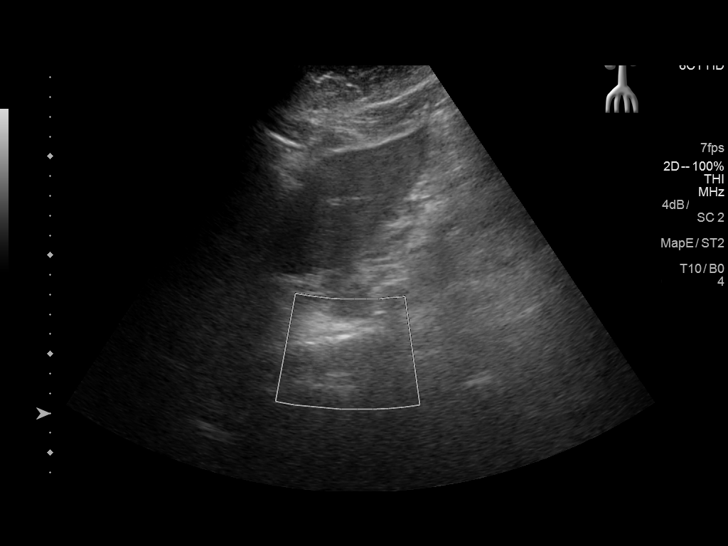
[im 78/86]
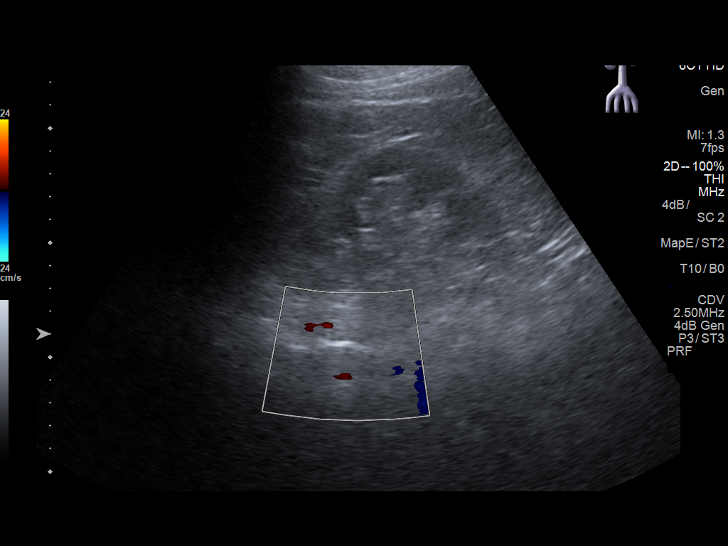
[im 86/86]
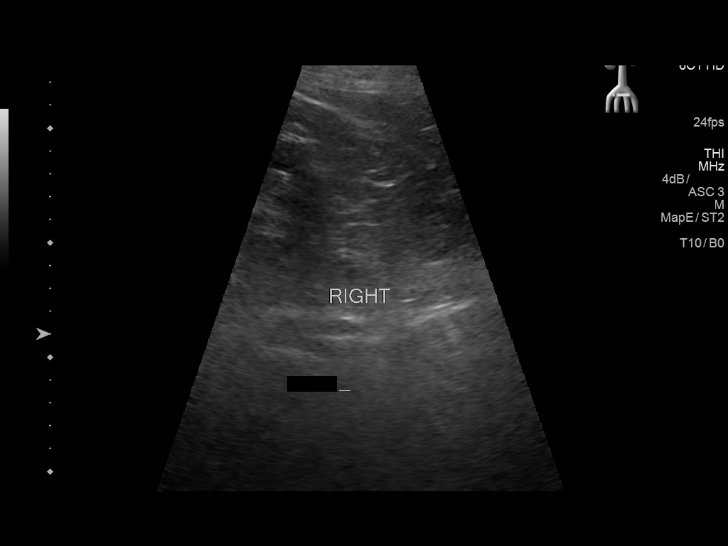

[13 of 25 positions shown; findings below may reference images not displayed]

FINDINGS: Gallbladder: Sludge in the gallbladder. No definite stones although
suboptimally seen. No gallbladder wall thickening or pericholecystic
fluid. Sonographic Murphy's sign absent.

Common bile duct: Diameter: 5 mm

Liver: Poor characterization of the liver due to poor sonic
penetration and body habitus. Coarse echogenic liver with poor sonic
penetration compatible with diffuse hepatic steatosis. No definite
focal lesions seen. Low index of confidence for sensitivity for
subtle lesions.

IVC: Normal where seen.

Pancreas: Visualized part of the pancreatic body appears normal with
the pancreatic head and tail not well seen due to overlying bowel
gas.

Spleen: Size and appearance within normal limits.

Right Kidney: Length: 8.1 cm. Echogenicity within normal limits. No
mass or hydronephrosis visualized. Thin cortex.

Left Kidney: Length: 8.3 cm. Echogenicity within normal limits. No
mass or hydronephrosis visualized. Thinned cortex.

Abdominal aorta: Atheromatous plaque but no aneurysm.

Other findings: None.
IMPRESSION: 1. Today' s exam must be understood to be significantly limited by
the patient body habitus and difficulty controlling respirations or
assuming different positions comfortably. CT might prove a useful
adjunct in further assessment.
2. Diffuse hepatic steatosis. No definite focal liver lesion is seen
but today's exam does not have a height negative predictive value
for liver lesions.
3. Sludge in the gallbladder.
4. Bilateral renal atrophy.
5. Atheromatous abdominal aorta.

## 2017-05-22 ENCOUNTER — Ambulatory Visit: Payer: Medicare Other | Admitting: Surgery

## 2017-05-29 ENCOUNTER — Encounter: Payer: Medicare Other | Attending: Physician Assistant | Admitting: Physician Assistant

## 2017-05-29 DIAGNOSIS — Z794 Long term (current) use of insulin: Secondary | ICD-10-CM | POA: Diagnosis not present

## 2017-05-29 DIAGNOSIS — E1122 Type 2 diabetes mellitus with diabetic chronic kidney disease: Secondary | ICD-10-CM | POA: Diagnosis not present

## 2017-05-29 DIAGNOSIS — L89620 Pressure ulcer of left heel, unstageable: Secondary | ICD-10-CM | POA: Diagnosis not present

## 2017-05-29 DIAGNOSIS — M069 Rheumatoid arthritis, unspecified: Secondary | ICD-10-CM | POA: Insufficient documentation

## 2017-05-29 DIAGNOSIS — N186 End stage renal disease: Secondary | ICD-10-CM | POA: Diagnosis not present

## 2017-05-29 DIAGNOSIS — E11621 Type 2 diabetes mellitus with foot ulcer: Secondary | ICD-10-CM | POA: Diagnosis present

## 2017-05-29 DIAGNOSIS — J449 Chronic obstructive pulmonary disease, unspecified: Secondary | ICD-10-CM | POA: Diagnosis not present

## 2017-05-29 DIAGNOSIS — E1140 Type 2 diabetes mellitus with diabetic neuropathy, unspecified: Secondary | ICD-10-CM | POA: Diagnosis not present

## 2017-05-29 DIAGNOSIS — I120 Hypertensive chronic kidney disease with stage 5 chronic kidney disease or end stage renal disease: Secondary | ICD-10-CM | POA: Insufficient documentation

## 2017-05-29 DIAGNOSIS — I739 Peripheral vascular disease, unspecified: Secondary | ICD-10-CM | POA: Insufficient documentation

## 2017-05-30 NOTE — Progress Notes (Addendum)
[name] ([id number]) Visit Report for 05/29/2017 Allergy List Details Patient Name: [name], [name]. Date of Service: 05/29/2017 12:30 PM Medical Record Number: [id number] Patient Account Number: [id number] Date of Birth/Sex: 11/03/1928 (84 y.o. Female) Treating RN: Renne Crigler Primary Care Nesiah Jump: Fulton Mole Other Clinician: Referring Sherri Mcarthy: Referral, Self Treating Jasiya Markie/Extender: Rudene Re in Treatment: 0 Allergies Active Allergies No Known Allergies Allergy Notes Electronic Signature(s) Signed: 05/29/2017 5:09:14 PM By: Renne Crigler Entered By: Renne Crigler on 05/29/2017 12:51:15 [name], [name] ([id number]) -------------------------------------------------------------------------------- Arrival Information Details Patient Name: [name]. Date of Service: 05/29/2017 12:30 PM Medical Record Number: [id number] Patient Account Number: [id number] Date of Birth/Sex: 13-Mar-1929 (84 y.o. Female) Treating RN: Renne Crigler Primary Care Pakou Rainbow: Fulton Mole Other Clinician: Referring Waldon Sheerin: Referral, Self Treating Javontay Vandam/Extender: Rudene Re in Treatment: 0 Visit Information Patient Arrived: Wheel Chair Arrival Time: 12:50 Accompanied By: daughter Transfer Assistance: EasyPivot Patient Lift Patient Identification Verified: Yes Secondary Verification Process Yes Completed: Patient Has Alerts: Yes Patient Alerts: Left ABI 1.98 DM TYPE II Electronic Signature(s) Signed: 05/29/2017 5:09:14 PM By: Renne Crigler Entered By: Renne Crigler on 05/29/2017 13:57:56 [name], [name] ([id number]) -------------------------------------------------------------------------------- Clinic Level of Care Assessment Details Patient Name: [name], [name]. Date of Service: 05/29/2017 12:30 PM Medical Record Number: [id number] Patient Account Number: [id number] Date of Birth/Sex: 04/10/1929 (84 y.o. Female) Treating RN: Renne Crigler Primary Care Ronith Berti: Fulton Mole Other Clinician: Referring Laneka Mcgrory: Referral, Self Treating Kipp Shank/Extender: Linwood Dibbles, HOYT Weeks in Treatment: 0 Clinic Level of Care Assessment Items TOOL 2 Quantity Score X - Use when only an EandM is performed on the INITIAL visit 1 0 ASSESSMENTS - Nursing Assessment / Reassessment X - General Physical Exam (combine w/ comprehensive assessment (listed just below) when 1 20 performed on new pt. evals) X- 1 25 Comprehensive Assessment (HX, ROS, Risk Assessments, Wounds Hx, etc.) ASSESSMENTS - Wound and Skin Assessment / Reassessment X - Simple Wound Assessment / Reassessment - one wound 1 5 []  - 0 Complex Wound Assessment / Reassessment - multiple wounds []  - 0 Dermatologic / Skin Assessment (not related to wound area) ASSESSMENTS - Ostomy and/or Continence Assessment and Care []  - Incontinence Assessment and Management 0 []  - 0 Ostomy Care Assessment and Management (repouching, etc.) PROCESS - Coordination of Care X - Simple Patient / Family Education for ongoing care 1 15 []  - 0 Complex (extensive) Patient / Family Education for ongoing care []  - 0 Staff obtains Chiropractor, Records, Test Results / Process Orders []  - 0 Staff telephones HHA, Nursing Homes / Clarify orders / etc []  - 0 Routine Transfer to another Facility (non-emergent condition) []  - 0 Routine Hospital Admission (non-emergent condition) []  - 0 New Admissions / Manufacturing engineer / Ordering NPWT, Apligraf, etc. []  - 0 Emergency Hospital Admission (emergent condition) X- 1 10 Simple Discharge Coordination []  - 0 Complex (extensive) Discharge Coordination PROCESS - Special Needs []  - Pediatric / Minor Patient Management 0 []  - 0 Isolation Patient Management [name], [name] ([id number]) []  - 0 Hearing / Language / Visual special needs []  - 0 Assessment of Community assistance (transportation, D/C planning, etc.) []  - 0 Additional assistance /  Altered mentation []  - 0 Support Surface(s) Assessment (bed, cushion, seat, etc.) INTERVENTIONS - Wound Cleansing / Measurement X - Wound Imaging (photographs - any number of wounds) 1 5 []  - 0 Wound Tracing (instead of photographs) X- 1 5 Simple Wound Measurement - one wound []  - 0 Complex Wound Measurement - multiple wounds  X- 1 5 Simple Wound Cleansing - one wound []  - 0 Complex Wound Cleansing - multiple wounds INTERVENTIONS - Wound Dressings X - Small Wound Dressing one or multiple wounds 1 10 []  - 0 Medium Wound Dressing one or multiple wounds []  - 0 Large Wound Dressing one or multiple wounds []  - 0 Application of Medications - injection INTERVENTIONS - Miscellaneous []  - External ear exam 0 []  - 0 Specimen Collection (cultures, biopsies, blood, body fluids, etc.) []  - 0 Specimen(s) / Culture(s) sent or taken to Lab for analysis []  - 0 Patient Transfer (multiple staff / / Similar devices) []  - 0 Simple Staple / Suture removal (25 or less) []  - 0 Complex Staple / Suture removal (26 or more) []  - 0 Hypo / Hyperglycemic Management (close monitor of Blood Glucose) X- 1 15 Ankle / Brachial Index (ABI) - do not check if billed separately Has the patient been seen at the hospital within the last three years: Yes Total Score: 115 Level Of Care: New/Established - Level 3 Electronic Signature(s) Signed: 05/29/2017 5:09:14 PM By: Entered By: on 05/29/2017 13:53:37 Aleman, ( ) -------------------------------------------------------------------------------- Encounter Discharge Information Details Patient Name: . Date of Service: 05/29/2017 12:30 PM Medical Record Number: Patient Account Number: Date of Birth/Sex: 04-07-1929 (84 y.o. Female) Treating RN: Renne Crigler Primary Care Ebony Yorio: Renne Crigler Other Clinician: Referring Nayomi Tabron: Referral, Self Treating  Keeon Zurn/Extender: 07/27/2017, HOYT Weeks in Treatment: 0 Encounter Discharge Information Items Discharge Pain Level: 0 Discharge Condition: Stable Ambulatory Status: Wheelchair Discharge Destination: Home Transportation: Private Auto Accompanied By: daughter Schedule Follow-up Appointment: Yes Medication Reconciliation completed and No provided to Patient/Care Bridie Colquhoun: Provided on Clinical Summary of Care: 05/29/2017 Form Type Recipient Paper Patient [facility] Electronic Signature(s) Signed: 05/29/2017 5:09:14 PM By: 07/27/2017 Entered By: [id number] on 05/29/2017 13:54:55 [name], [name] ([id number]) -------------------------------------------------------------------------------- Lower Extremity Assessment Details Patient Name: Renne Crigler. Date of Service: 05/29/2017 12:30 PM Medical Record Number: Linwood Dibbles Patient Account Number: 07/27/2017 Date of Birth/Sex: 11-05-1928 (84 y.o. Female) Treating RN: Renne Crigler Primary Care Darcell Sabino: Renne Crigler Other Clinician: Referring Ammon Muscatello: Referral, Self Treating Javonne Dorko/Extender: STONE III, HOYT Weeks in Treatment: 0 Edema Assessment Assessed: [Left: No] [Right: No] Edema: [Left: Ye] [Right: s] Vascular Assessment Claudication: Claudication Assessment [Left:None] Pulses: Dorsalis Pedis Palpable: [Left:No] Doppler Audible: [Left:Yes] Posterior Tibial Extremity colors, hair growth, and conditions: Extremity Color: [Left:Normal] Hair Growth on Extremity: [Left:Yes] Temperature of Extremity: [Left:Cool] Capillary Refill: [Left:> 3 seconds] Blood Pressure: Brachial: [Left:92] Dorsalis Pedis: 172 [Left:Dorsalis Pedis:] Ankle: Posterior Tibial: 182 [Left:Posterior Tibial: 1.98] Toe Nail Assessment Left: Right: Thick: Yes Discolored: Yes Deformed: Yes Improper Length and Hygiene: Yes Electronic Signature(s) Signed: 05/29/2017 5:09:14 PM By: [name] Entered By: [id number] on 05/29/2017  13:16:14 [name], 07/27/2017 ([id number]) -------------------------------------------------------------------------------- Multi Wound Chart Details Patient Name: [id number]. Date of Service: 05/29/2017 12:30 PM Medical Record Number: [id number] Patient Account Number: Renne Crigler Date of Birth/Sex: 12-Dec-1928 (84 y.o. Female) Treating RN: Renne Crigler Primary Care Giorgi Debruin: Renne Crigler Other Clinician: Referring Tatisha Cerino: Referral, Self Treating Caffie Sotto/Extender: STONE III, HOYT Weeks in Treatment: 0 Vital Signs Height(in): 60 Pulse(bpm): 82 Weight(lbs): 188 Blood Pressure(mmHg): Body Mass Index(BMI): 37 Temperature(F): 98.2 Respiratory Rate 18 (breaths/min): Photos: [1:No Photos] [N/A:N/A] Wound Location: [1:Left Calcaneus] [N/A:N/A] Wounding Event: [1:Blister] [N/A:N/A] Primary Etiology: [1:Pressure Ulcer] [N/A:N/A] Comorbid History: [1:Lymphedema, Chronic Obstructive Pulmonary Disease (COPD), Hypertension, Type II Diabetes, End Stage Renal Disease, Rheumatoid Arthritis, Neuropathy] [N/A:N/A] Date Acquired: [1:05/12/2017] [  N/A:N/A] Weeks of Treatment: [1:0] [N/A:N/A] Wound Status: [1:Open] [N/A:N/A] Measurements L x W x D [1:4.5x2.2x0.1] [N/A:N/A] (cm) Area (cm) : [1:7.775] [N/A:N/A] Volume (cm) : [1:0.778] [N/A:N/A] Classification: [1:Unstageable/Unclassified] [N/A:N/A] Exudate Amount: [1:None Present] [N/A:N/A] Wound Margin: [1:Distinct, outline attached] [N/A:N/A] Granulation Amount: [1:None Present (0%)] [N/A:N/A] Necrotic Amount: [1:Large (67-100%)] [N/A:N/A] Necrotic Tissue: [1:Eschar] [N/A:N/A] Epithelialization: [1:Large (67-100%)] [N/A:N/A] Periwound Skin Texture: [1:Callus: Yes Excoriation: No Induration: No Crepitus: No Rash: No Scarring: No] [N/A:N/A] Periwound Skin Moisture: [1:Dry/Scaly: Yes Maceration: No] [N/A:N/A] Periwound Skin Color: [1:Atrophie Blanche: No Cyanosis: No Ecchymosis: No Erythema: No] [N/A:N/A] Hemosiderin Staining:  No Mottled: No Pallor: No Rubor: No Temperature: No Abnormality N/A N/A Tenderness on Palpation: No N/A N/A Wound Preparation: Ulcer Cleansing: N/A N/A Rinsed/Irrigated with Saline Topical Anesthetic Applied: Other: lidocaine 4% Treatment Notes Electronic Signature(s) Signed: 05/29/2017 5:09:14 PM By: Renne Crigler Entered By: Renne Crigler on 05/29/2017 13:47:35 [name], [name] ([id number]) -------------------------------------------------------------------------------- Multi-Disciplinary Care Plan Details Patient Name: [name]. Date of Service: 05/29/2017 12:30 PM Medical Record Number: [id number] Patient Account Number: [id number] Date of Birth/Sex: 1929-01-14 (84 y.o. Female) Treating RN: Renne Crigler Primary Care Austin Herd: Fulton Mole Other Clinician: Referring Yehya Brendle: Referral, Self Treating Burris Matherne/Extender: Linwood Dibbles, HOYT Weeks in Treatment: 0 Active Inactive ` Orientation to the Wound Care Program Nursing Diagnoses: Knowledge deficit related to the wound healing center program Goals: Patient/caregiver will verbalize understanding of the Wound Healing Center Program Date Initiated: 05/29/2017 Target Resolution Date: 06/26/2017 Goal Status: Active Interventions: Provide education on orientation to the wound center Notes: ` Wound/Skin Impairment Nursing Diagnoses: Impaired tissue integrity Goals: Patient/caregiver will verbalize understanding of skin care regimen Date Initiated: 05/29/2017 Target Resolution Date: 06/26/2017 Goal Status: Active Ulcer/skin breakdown will have a volume reduction of 30% by week 4 Date Initiated: 05/29/2017 Target Resolution Date: 06/26/2017 Goal Status: Active Interventions: Assess ulceration(s) every visit Treatment Activities: Skin care regimen initiated : 05/29/2017 Notes: Electronic Signature(s) Signed: 05/29/2017 5:09:14 PM By: Renne Crigler Entered By: Renne Crigler on 05/29/2017 13:47:04 [name], [name]  ([id number]) -------------------------------------------------------------------------------- Pain Assessment Details Patient Name: [name]. Date of Service: 05/29/2017 12:30 PM Medical Record Number: [id number] Patient Account Number: [id number] Date of Birth/Sex: Sep 27, 1928 (84 y.o. Female) Treating RN: Renne Crigler Primary Care Uthman Mroczkowski: Fulton Mole Other Clinician: Referring Saleemah Mollenhauer: Referral, Self Treating Zyquan Crotty/Extender: Rudene Re in Treatment: 0 Active Problems Location of Pain Severity and Description of Pain Patient Has Paino No Site Locations Pain Management and Medication Current Pain Management: Electronic Signature(s) Signed: 05/29/2017 5:09:14 PM By: Renne Crigler Entered By: Renne Crigler on 05/29/2017 12:50:43 [name], [name] ([id number]) -------------------------------------------------------------------------------- Patient/Caregiver Education Details Patient Name: [name]. Date of Service: 05/29/2017 12:30 PM Medical Record Number: [id number] Patient Account Number: [id number] Date of Birth/Gender: [birth year] (84 y.o. Female) Treating RN: Renne Crigler Primary Care Physician: Fulton Mole Other Clinician: Referring Physician: Referral, Self Treating Physician/Extender: Skeet Simmer in Treatment: 0 Education Assessment Education Provided To: Patient and Caregiver Education Topics Provided Welcome To The Wound Care Center: Handouts: Welcome To The Wound [facility] Center Methods: Explain/Verbal Responses: State content correctly Wound/Skin Impairment: Handouts: Caring for Your Ulcer Methods: Explain/Verbal Responses: State content correctly Electronic Signature(s) Signed: 05/29/2017 5:09:14 PM By: Renne Crigler Entered By: Renne Crigler on 05/29/2017 13:55:12 [name], [name] ([id number]) -------------------------------------------------------------------------------- Wound Assessment Details Patient Name:  [name]. Date of Service: 05/29/2017 12:30 PM Medical Record Number: [id number] Patient Account Number: [id number] Date of Birth/Sex: June 13, 1928 (84 y.o. Female) Treating RN: Renne Crigler Primary Care Reylynn Vanalstine: Fulton Mole  Other Clinician: Referring Jayd Forrey: Referral, Self Treating Francoise Chojnowski/Extender: STONE III, HOYT Weeks in Treatment: 0 Wound Status Wound Number: 1 Primary Pressure Ulcer Etiology: Wound Location: Left Calcaneus Wound Open Wounding Event: Blister Status: Date Acquired: 05/12/2017 Comorbid Lymphedema, Chronic Obstructive Pulmonary Weeks Of Treatment: 0 History: Disease (COPD), Hypertension, Type II Clustered Wound: No Diabetes, End Stage Renal Disease, Rheumatoid Arthritis, Neuropathy Photos Photo Uploaded By: Renne Crigler on 05/29/2017 14:32:41 Wound Measurements Length: (cm) 4.5 Width: (cm) 2.2 Depth: (cm) 0.1 Area: (cm) 7.775 Volume: (cm) 0.778 % Reduction in Area: % Reduction in Volume: Epithelialization: Large (67-100%) Tunneling: No Undermining: No Wound Description Classification: Unstageable/Unclassified Wound Margin: Distinct, outline attached Exudate Amount: None Present Foul Odor After Cleansing: No Slough/Fibrino Yes Wound Bed Granulation Amount: None Present (0%) Necrotic Amount: Large (67-100%) Necrotic Quality: Eschar Periwound Skin Texture Texture Color No Abnormalities Noted: No No Abnormalities Noted: No Callus: Yes Atrophie Blanche: No Crepitus: No Cyanosis: No Excoriation: No Ecchymosis: No [name], GODETTE. ([id number]) Induration: No Erythema: No Rash: No Hemosiderin Staining: No Scarring: No Mottled: No Pallor: No Moisture Rubor: No No Abnormalities Noted: No Dry / Scaly: Yes Temperature / Pain Maceration: No Temperature: No Abnormality Wound Preparation Ulcer Cleansing: Rinsed/Irrigated with Saline Topical Anesthetic Applied: Other: lidocaine 4%, Treatment Notes Wound #1 (Left  Calcaneus) 1. Cleansed with: Clean wound with Normal Saline 2. Anesthetic Topical Lidocaine 4% cream to wound bed prior to debridement 3. Peri-wound Care: Other peri-wound care (specify in notes) 5. Secondary Dressing Applied Dry Gauze Notes betadine with dry gauze and secure with tape Electronic Signature(s) Signed: 05/29/2017 5:09:14 PM By: Renne Crigler Entered By: Renne Crigler on 05/29/2017 13:06:43 [name], [name] ([id number]) -------------------------------------------------------------------------------- Vitals Details Patient Name: [name]. Date of Service: 05/29/2017 12:30 PM Medical Record Number: [id number] Patient Account Number: [id number] Date of Birth/Sex: 1929/01/02 (84 y.o. Female) Treating RN: Renne Crigler Primary Care Robertta Halfhill: Fulton Mole Other Clinician: Referring Shadi Larner: Referral, Self Treating Mattisyn Cardona/Extender: STONE III, HOYT Weeks in Treatment: 0 Vital Signs Time Taken: 01:41 Temperature (F): 98.2 Height (in): 60 Pulse (bpm): 82 Source: Stated Respiratory Rate (breaths/min): 18 Weight (lbs): 188 Reference Range: 80 - 120 mg / dl Source: Measured Body Mass Index (BMI): 36.7 Electronic Signature(s) Signed: 05/29/2017 5:09:14 PM By: Renne Crigler Entered By: Renne Crigler on 05/29/2017 13:45:54

## 2017-05-30 NOTE — Progress Notes (Signed)
[name] ([id number]) Visit Report for 05/29/2017 Abuse/Suicide Risk Screen Details Patient Name: [name], [name]. Date of Service: 05/29/2017 12:30 PM Medical Record Number: [id number] Patient Account Number: [id number] Date of Birth/Sex: 10/09/1928 (84 y.o. Female) Treating RN: Renne Crigler Primary Care Capricia Serda: Fulton Mole Other Clinician: Referring Buddy Loeffelholz: Referral, Self Treating Aalayah Riles/Extender: STONE III, HOYT Weeks in Treatment: 0 Abuse/Suicide Risk Screen Items Answer ABUSE/SUICIDE RISK SCREEN: Has anyone close to you tried to hurt or harm you recentlyo No Do you feel uncomfortable with anyone in your familyo No Has anyone forced you do things that you didnot want to doo No Do you have any thoughts of harming yourselfo No Patient displays signs or symptoms of abuse and/or neglect. No Electronic Signature(s) Signed: 05/29/2017 5:09:14 PM By: Renne Crigler Entered By: Renne Crigler on 05/29/2017 13:00:13 Zirbes, Ellan Lambert ([id number]) -------------------------------------------------------------------------------- Activities of Daily Living Details Patient Name: [name]. Date of Service: 05/29/2017 12:30 PM Medical Record Number: [id number] Patient Account Number: [id number] Date of Birth/Sex: [birth year] (84 y.o. Female) Treating RN: Renne Crigler Primary Care Sherene Plancarte: Fulton Mole Other Clinician: Referring Osten Janek: Referral, Self Treating Amil Bouwman/Extender: Linwood Dibbles, HOYT Weeks in Treatment: 0 Activities of Daily Living Items Answer Activities of Daily Living (Please select one for each item) Drive Automobile Need Assistance Take Medications Need Assistance Use Telephone Need Assistance Care for Appearance Need Assistance Use Toilet Need Assistance Bath / Shower Need Assistance Dress Self Need Assistance Feed Self Need Assistance Walk Need Assistance Get In / Out Bed Need Assistance Housework Need Assistance Prepare Meals Need  Assistance Handle Money Need Assistance Shop for Self Need Assistance Electronic Signature(s) Signed: 05/29/2017 5:09:14 PM By: Renne Crigler Entered By: Renne Crigler on 05/29/2017 13:00:45 Sorey, Ellan Lambert ([id number]) -------------------------------------------------------------------------------- Education Assessment Details Patient Name: [name]. Date of Service: 05/29/2017 12:30 PM Medical Record Number: [id number] Patient Account Number: [id number] Date of Birth/Sex: 1928/08/28 (84 y.o. Female) Treating RN: Renne Crigler Primary Care Tajuan Dufault: Fulton Mole Other Clinician: Referring Emmanuela Ghazi: Referral, Self Treating Johnmichael Melhorn/Extender: Skeet Simmer in Treatment: 0 Primary Learner Assessed: Caregiver daughter- Ala Dach Learning Preferences/Education Level/Primary Language Learning Preference: Explanation Highest Education Level: High School Preferred Language: English Cognitive Barrier Assessment/Beliefs Language Barrier: No Translator Needed: No Memory Deficit: No Cultural/Religious Beliefs Affecting Medical Care: No Physical Barrier Assessment Impaired Vision: Yes Glasses Impaired Hearing: No Decreased Hand dexterity: No Knowledge/Comprehension Assessment Knowledge Level: Medium Comprehension Level: Medium Ability to understand written Medium instructions: Ability to understand verbal Medium instructions: Motivation Assessment Anxiety Level: Calm Cooperation: Cooperative Education Importance: Acknowledges Need Interest in Health Problems: Asks Questions Perception: Coherent Willingness to Engage in Self- High Management Activities: Readiness to Engage in Self- High Management Activities: Electronic Signature(s) Signed: 05/29/2017 5:09:14 PM By: Renne Crigler Entered By: Renne Crigler on 05/29/2017 13:01:35 Skoczylas, Ellan Lambert ([id number]) -------------------------------------------------------------------------------- Fall Risk  Assessment Details Patient Name: [name]. Date of Service: 05/29/2017 12:30 PM Medical Record Number: [id number] Patient Account Number: [id number] Date of Birth/Sex: July 05, 1928 (84 y.o. Female) Treating RN: Renne Crigler Primary Care Sharah Finnell: Fulton Mole Other Clinician: Referring Mandee Pluta: Referral, Self Treating Frankie Zito/Extender: Linwood Dibbles, HOYT Weeks in Treatment: 0 Fall Risk Assessment Items Have you had 2 or more falls in the last 12 monthso 0 No Have you had any fall that resulted in injury in the last 12 monthso 0 No FALL RISK ASSESSMENT: History of falling - immediate or within 3 months 0 No Secondary diagnosis 0 No Ambulatory aid None/bed rest/wheelchair/nurse 0 No Crutches/cane/walker 0 No Furniture 0 No  IV Access/Saline Lock 0 No Gait/Training Normal/bed rest/immobile 0 No Weak 0 No Impaired 0 No Mental Status Oriented to own ability 0 No Electronic Signature(s) Signed: 05/29/2017 5:09:14 PM By: Renne Crigler Entered By: Renne Crigler on 05/29/2017 13:01:50 Gick, Ellan Lambert ([id number]) -------------------------------------------------------------------------------- Nutrition Risk Assessment Details Patient Name: [name]. Date of Service: 05/29/2017 12:30 PM Medical Record Number: [id number] Patient Account Number: [id number] Date of Birth/Sex: [birth year] (84 y.o. Female) Treating RN: Renne Crigler Primary Care Tullio Chausse: Fulton Mole Other Clinician: Referring Catelynn Sparger: Referral, Self Treating Taylynn Easton/Extender: STONE III, HOYT Weeks in Treatment: 0 Height (in): Weight (lbs): Body Mass Index (BMI): Nutrition Risk Assessment Items NUTRITION RISK SCREEN: I have an illness or condition that made me change the kind and/or amount of 0 No food I eat I eat fewer than two meals per day 0 No I eat few fruits and vegetables, or milk products 0 No I have three or more drinks of beer, liquor or wine almost every day 0 No I have tooth or  mouth problems that make it hard for me to eat 0 No I don't always have enough money to buy the food I need 0 No I eat alone most of the time 0 No I take three or more different prescribed or over-the-counter drugs a day 0 No Without wanting to, I have lost or gained 10 pounds in the last six months 0 No I am not always physically able to shop, cook and/or feed myself 0 No Nutrition Protocols Good Risk Protocol 0 No interventions needed Moderate Risk Protocol Electronic Signature(s) Signed: 05/29/2017 5:09:14 PM By: Renne Crigler Entered By: Renne Crigler on 05/29/2017 13:01:57

## 2017-06-05 ENCOUNTER — Ambulatory Visit: Payer: Medicare Other | Admitting: Physician Assistant

## 2017-10-14 ENCOUNTER — Emergency Department
Admission: EM | Admit: 2017-10-14 | Discharge: 2017-10-14 | Disposition: A | Discharge status: Home / Self Care | Payer: Medicare Other | Attending: Emergency Medicine | Admitting: Emergency Medicine

## 2017-10-14 ENCOUNTER — Emergency Department: Payer: Medicare Other

## 2017-10-14 ENCOUNTER — Encounter: Payer: Self-pay | Admitting: Emergency Medicine

## 2017-10-14 DIAGNOSIS — R0602 Shortness of breath: Secondary | ICD-10-CM

## 2017-10-14 DIAGNOSIS — I130 Hypertensive heart and chronic kidney disease with heart failure and stage 1 through stage 4 chronic kidney disease, or unspecified chronic kidney disease: Secondary | ICD-10-CM | POA: Insufficient documentation

## 2017-10-14 DIAGNOSIS — N183 Chronic kidney disease, stage 3 (moderate): Secondary | ICD-10-CM | POA: Diagnosis not present

## 2017-10-14 DIAGNOSIS — E119 Type 2 diabetes mellitus without complications: Secondary | ICD-10-CM | POA: Diagnosis not present

## 2017-10-14 DIAGNOSIS — F17220 Nicotine dependence, chewing tobacco, uncomplicated: Secondary | ICD-10-CM | POA: Insufficient documentation

## 2017-10-14 DIAGNOSIS — Z794 Long term (current) use of insulin: Secondary | ICD-10-CM | POA: Diagnosis not present

## 2017-10-14 DIAGNOSIS — R059 Cough, unspecified: Secondary | ICD-10-CM

## 2017-10-14 DIAGNOSIS — J810 Acute pulmonary edema: Secondary | ICD-10-CM | POA: Insufficient documentation

## 2017-10-14 DIAGNOSIS — J45909 Unspecified asthma, uncomplicated: Secondary | ICD-10-CM | POA: Diagnosis not present

## 2017-10-14 DIAGNOSIS — I5032 Chronic diastolic (congestive) heart failure: Secondary | ICD-10-CM | POA: Insufficient documentation

## 2017-10-14 DIAGNOSIS — R05 Cough: Secondary | ICD-10-CM | POA: Insufficient documentation

## 2017-10-14 DIAGNOSIS — Z79899 Other long term (current) drug therapy: Secondary | ICD-10-CM | POA: Insufficient documentation

## 2017-10-14 DIAGNOSIS — R0989 Other specified symptoms and signs involving the circulatory and respiratory systems: Secondary | ICD-10-CM

## 2017-10-14 LAB — CBC
HCT: 38.3 % (ref 35.0–47.0)
Hemoglobin: 12.2 g/dL (ref 12.0–16.0)
MCH: 28.5 pg (ref 26.0–34.0)
MCHC: 32 g/dL (ref 32.0–36.0)
MCV: 89.1 fL (ref 80.0–100.0)
PLATELETS: 231 10*3/uL (ref 150–440)
RBC: 4.3 MIL/uL (ref 3.80–5.20)
RDW: 16.3 % — AB (ref 11.5–14.5)
WBC: 12.8 10*3/uL — AB (ref 3.6–11.0)

## 2017-10-14 LAB — BASIC METABOLIC PANEL
Anion gap: 7 (ref 5–15)
BUN: 79 mg/dL — AB (ref 6–20)
CO2: 25 mmol/L (ref 22–32)
Calcium: 8.8 mg/dL — ABNORMAL LOW (ref 8.9–10.3)
Chloride: 104 mmol/L (ref 101–111)
Creatinine, Ser: 2.11 mg/dL — ABNORMAL HIGH (ref 0.44–1.00)
GFR calc non Af Amer: 20 mL/min — ABNORMAL LOW (ref >60)
GFR, EST AFRICAN AMERICAN: 23 mL/min — AB (ref >60)
Glucose, Bld: 150 mg/dL — ABNORMAL HIGH (ref 65–99)
POTASSIUM: 5.5 mmol/L — AB (ref 3.5–5.1)
SODIUM: 136 mmol/L (ref 135–145)

## 2017-10-14 MED ORDER — FUROSEMIDE 40 MG PO TABS
40.0000 mg | ORAL_TABLET | Freq: Once | ORAL | Status: AC
  Administered 2017-10-14: 40 mg via ORAL
  Filled 2017-10-14: qty 1

## 2017-10-14 MED ORDER — HYDROCODONE-HOMATROPINE 5-1.5 MG/5ML PO SYRP: 5.0000 mL | ORAL_SOLUTION | Freq: Four times a day (QID) | ORAL | 0 refills | Status: AC | PRN

## 2017-10-14 MED ORDER — ALBUTEROL SULFATE (2.5 MG/3ML) 0.083% IN NEBU
5.0000 mg | INHALATION_SOLUTION | Freq: Once | RESPIRATORY_TRACT | Status: AC
  Administered 2017-10-14: 5 mg via RESPIRATORY_TRACT
  Filled 2017-10-14: qty 6

## 2017-10-14 MED ORDER — AZITHROMYCIN 250 MG PO TABS: ORAL_TABLET | ORAL | 0 refills | Status: AC

## 2017-10-14 NOTE — Discharge Instructions (Addendum)
As we discussed, I think it is more likely that you have some fluid in your lungs causing her cough and shortness of breath rather than acute infection.  However after discussing it with you and your daughter, I agreed to try azithromycin and cough medicine which worked in the past.  However, it is very important that you continue taking your Lasix and other medications as prescribed and follow-up with your regular doctor at the next available opportunity.  Your potassium was slightly elevated today but not nearly as much as it has been in the past, and the Lasix will help bring it down.  Please try to avoid high sodium intake and call either this afternoon or tomorrow morning to schedule a follow-up appointment with your PCP.  Return to the emergency department if you develop new or worsening symptoms that concern you.

## 2017-10-14 NOTE — ED Provider Notes (Signed)
[facility] Emergency Department Provider Note  ____________________________________________   First MD Initiated Contact with Patient 10/14/17 1504     (approximate)  I have reviewed the triage vital signs and the nursing notes.   HISTORY  Chief Complaint Shortness of Breath    HPI [name] is a 84 y.o. female with extensive chronic medical history but who is generally been in stable health for the last couple of years and has not been admitted at [facility] since 2017.  She presents with her daughter for evaluation of about a week of gradually worsening cough and some mild shortness of breath.  They report that she has been coughing up some green sputum and that she is more short of breath than usual.  She uses oxygen at baseline.  She has been doing well and not had to go to the doctor very much for the last couple of years so this is a change.  She lives with and is primarily cared for by her son who is her healthcare power of attorney and her daughter reports that the son has been ill recently with a viral respiratory infection.  They are adamant that we not make changes to her furosemide dosing because "she already pees all the time" and they feel strongly that azithromycin and cough medicine will make her feel better.  She denies fever/chills, chest pain, nausea, vomiting, and abdominal pain.  Symptoms are mild but persistent for the last week and nothing in particular makes them better.  Exertion makes it worse but she has a low baseline level of activity.  Past Medical History:  Diagnosis Date  . Asthma   . Chronic diastolic CHF (congestive heart failure) (HCC)   . CKD (chronic kidney disease), stage III (HCC)   . Diabetes mellitus, insulin dependent (IDDM), uncontrolled (HCC)   . Essential hypertension   . Gout   . HLD (hyperlipidemia)   . Morbid obesity (HCC)   . Osteoarthritis     Patient Active Problem List   Diagnosis Date Noted  . Accelerated  hypertension 11/01/2015  . Morbid obesity (HCC)   . Chronic diastolic CHF (congestive heart failure) (HCC)   . CKD (chronic kidney disease), stage III (HCC)   . Asthma   . Shortness of breath 10/31/2015  . Lower extremity edema 10/31/2015  . Diabetes (HCC) 10/31/2015  . HLD (hyperlipidemia) 10/31/2015  . Ear pain 10/31/2015    Past Surgical History:  Procedure Laterality Date  . OVARIAN CYST REMOVAL      Prior to Admission medications   Medication Sig Start Date End Date Taking? Authorizing Provider  albuterol (PROVENTIL HFA;VENTOLIN HFA) 108 (90 Base) MCG/ACT inhaler Inhale 2 puffs into the lungs every 4 (four) hours as needed for wheezing or shortness of breath.    [provider]  amLODipine (NORVASC) 5 MG tablet Take 1 tablet (5 mg total) by mouth daily. 11/05/15   Alford Highland, MD  atorvastatin (LIPITOR) 20 MG tablet Take 20 mg by mouth at bedtime.    [provider]  azithromycin (ZITHROMAX) 250 MG tablet Take 2 tablets PO on day 1, then take 1 tablet PO daily for 4 more days 10/14/17   Loleta Rose, MD  diphenoxylate-atropine (LOMOTIL) 2.5-0.025 MG tablet Take 1-2 tablets by mouth 4 (four) times daily as needed for diarrhea or loose stools.    [provider]  furosemide (LASIX) 40 MG tablet Take 1 tablet (40 mg total) by mouth daily. 11/05/15   Wieting,  Richard, MD  gabapentin (NEURONTIN) 300 MG capsule Take 300 mg by mouth 3 (three) times daily.    [provider]  HYDROcodone-homatropine (HYCODAN) 5-1.5 MG/5ML syrup Take 5 mLs by mouth every 6 (six) hours as needed for cough. 10/14/17   Loleta Rose, MD  insulin aspart (NOVOLOG) 100 UNIT/ML injection Inject 32 Units into the skin 3 (three) times daily before meals.     [provider]  insulin detemir (LEVEMIR) 100 UNIT/ML injection Inject 72 Units into the skin at bedtime.    [provider]  loperamide (IMODIUM) 2 MG capsule Take 1 capsule (2 mg total) by mouth as needed  for diarrhea or loose stools. 11/02/15   Hower, Cletis Athens, MD  metoprolol (LOPRESSOR) 50 MG tablet Take 50 mg by mouth 2 (two) times daily.    [provider]  ofloxacin (OCUFLOX) 0.3 % ophthalmic solution Place 5 drops into the left ear 2 (two) times daily. 11/02/15   Hower, Cletis Athens, MD  oxyCODONE-acetaminophen (PERCOCET/ROXICET) 5-325 MG tablet Take 1 tablet by mouth every 6 (six) hours as needed for severe pain.     [provider]  sodium polystyrene (KAYEXALATE) 15 GM/60ML suspension Take 120 mLs (30 g total) by mouth once. Take one time on Tuesday 11/07/15   Alford Highland, MD    Allergies Amitriptyline; Influenza vaccines; Penicillins; Pneumococcal vaccines; Rosiglitazone; and Sulfa antibiotics  Family History  Problem Relation Age of Onset  . Melanoma Mother     Social History Social History   Tobacco Use  . Smoking status: Never Smoker  . Smokeless tobacco: Current User    Types: Snuff  Substance Use Topics  . Alcohol use: No    Alcohol/week: 0.0 oz  . Drug use: No    Review of Systems Constitutional: No fever/chills Eyes: No visual changes. ENT: No sore throat. Cardiovascular: Denies chest pain. Respiratory: +shortness of breath. Gastrointestinal: No abdominal pain.  No nausea, no vomiting.  No diarrhea.  No constipation. Genitourinary: Negative for dysuria. Musculoskeletal: Negative for neck pain.  Negative for back pain. Integumentary: Negative for rash. Neurological: Negative for headaches, focal weakness or numbness.   ____________________________________________   PHYSICAL EXAM:  VITAL SIGNS: ED Triage Vitals  Enc Vitals Group     BP 10/14/17 1133 121/86     Pulse Rate 10/14/17 1133 67     Resp 10/14/17 1133 16     Temp 10/14/17 1133 98.6 F (37 C)     Temp Source 10/14/17 1133 Oral     SpO2 10/14/17 1133 100 %     Weight 10/14/17 1133 90.7 kg (200 lb)     Height 10/14/17 1133 1.499 m (4\' 11" )     Head Circumference --      Peak  Flow --      Pain Score 10/14/17 1141 0     Pain Loc --      Pain Edu? --      Excl. in GC? --     Constitutional: Alert and oriented.  No acute distress, awake and alert, using home oxygen Eyes: Conjunctivae are normal.  Head: Atraumatic. Nose: No congestion/rhinnorhea. Mouth/Throat: Mucous membranes are moist. Neck: No stridor.  No meningeal signs.   Cardiovascular: Normal rate, regular rhythm. Good peripheral circulation. Grossly normal heart sounds. Respiratory: Normal respiratory effort.  No retractions.  Occasional thick sounding cough.  Some coarse breath sounds throughout lung fields, no wheezing at this time. Gastrointestinal: Soft and nontender. No distention.  Musculoskeletal: Trace peripheral edema in  bilateral lower extremities. No gross deformities of extremities. Neurologic:  Normal speech and language. No gross focal neurologic deficits are appreciated.  Skin:  Skin is warm, dry and intact. No rash noted.   ____________________________________________   LABS (all labs ordered are listed, but only abnormal results are displayed)  Labs Reviewed  BASIC METABOLIC PANEL - Abnormal; Notable for the following components:      Result Value   Potassium 5.5 (*)    Glucose, Bld 150 (*)    BUN 79 (*)    Creatinine, Ser 2.11 (*)    Calcium 8.8 (*)    GFR calc non Af Amer 20 (*)    GFR calc Af Amer 23 (*)    All other components within normal limits  CBC - Abnormal; Notable for the following components:   WBC 12.8 (*)    RDW 16.3 (*)    All other components within normal limits   ____________________________________________  EKG  ED ECG REPORT I, Loleta Rose, the attending physician, personally viewed and interpreted this ECG.  Date: 10/14/2017 EKG Time: 11:54 Rate: 64 Rhythm: normal sinus rhythm QRS Axis: normal Intervals: LVH ST/T Wave abnormalities: Non-specific ST segment / T-wave changes, but no evidence of acute ischemia. Narrative Interpretation: no  evidence of acute ischemia   ____________________________________________  RADIOLOGY I, Loleta Rose, personally viewed and evaluated these images (plain radiographs) as part of my medical decision making, as well as reviewing the written report by the radiologist.  ED MD interpretation:  mild interstitial edema  Official radiology report(s): Dg Chest 2 View  Result Date: 10/14/2017 CLINICAL DATA:  Shortness of breath. EXAM: CHEST - 2 VIEW COMPARISON:  Chest x-ray dated October 31, 2015. FINDINGS: Stable cardiomegaly. Atherosclerotic calcification of the aortic arch. Mild pulmonary vascular congestion and increased interstitial thickening bilaterally. No focal consolidation, pleural effusion, or pneumothorax. Unchanged elevation of the right hemidiaphragm. No acute osseous abnormality. IMPRESSION: 1. Mild pulmonary interstitial edema. Electronically Signed   By: Obie Dredge M.D.   On: 10/14/2017 12:19    ____________________________________________   PROCEDURES  Critical Care performed: No   Procedure(s) performed:   Procedures   ____________________________________________   INITIAL IMPRESSION / ASSESSMENT AND PLAN / ED COURSE  As part of my medical decision making, I reviewed the following data within the electronic MEDICAL RECORD NUMBER History obtained from family, Nursing notes reviewed and incorporated, Labs reviewed , EKG interpreted , Old chart reviewed, Radiograph reviewed  and Notes from prior ED visits    Differential diagnosis includes, but is not limited to, bronchitis (viral or bacterial), CHF, COPD, ACS, PE.  The patient's lab work is notable for potassium of 5.5, but looking back through the medical record she is chronically hyperkalemic and has been as high as the mid 6 range in the past.  Her creatinine is essentially at the baseline that it has been over the last few years.  Vital signs are stable and within normal limits.  The patient is in no acute distress.  I  explained to the daughter and patient that I think her symptoms are due to the mild pulmonary interstitial edema rather than an infection, but they are quite adamant about the azithromycin and cough syrup plan.  I explained how important it is that she continue taking her furosemide and I encouraged them to let me give her an extra dose of furosemide today.  The daughter cannot remember if the patient takes 20 mg twice a day or 40 mg twice a day  so I gave a dose of 40 mg by mouth.  I encouraged them to call her PCP at the next available opportunity to schedule a follow-up appointment and I gave strict return precautions if her symptoms get worse.  They understand and agree with the plan before I was even finished with my discussion the patient was stating that she was ready to go and she wants to go home.  I think that there is no benefit to keeping her in the hospital at this time but she will require close outpatient follow-up.  The daughter agrees.     ____________________________________________  FINAL CLINICAL IMPRESSION(S) / ED DIAGNOSES  Final diagnoses:  Cough  Shortness of breath  Pulmonary vascular congestion     MEDICATIONS GIVEN DURING THIS VISIT:  Medications  furosemide (LASIX) tablet 40 mg (has no administration in time range)  albuterol (PROVENTIL) (2.5 MG/3ML) 0.083% nebulizer solution 5 mg (5 mg Nebulization Given 10/14/17 1215)     ED Discharge Orders        Ordered    azithromycin (ZITHROMAX) 250 MG tablet     10/14/17 1537    HYDROcodone-homatropine (HYCODAN) 5-1.5 MG/5ML syrup  Every 6 hours PRN     10/14/17 1537       Note:  This document was prepared using Dragon voice recognition software and may include unintentional dictation errors.    Loleta Rose, MD 10/14/17 [phone]

## 2017-10-14 NOTE — ED Triage Notes (Signed)
Patient presents to the ED with congestion/shortness of breath x 5 days, worse this morning.  Patient is coughing up green phlegm.  Patient is in no obvious distress at this time.  Patient wears 2L of oxygen [place] at baseline.  Patient has history of COPD.

## 2017-10-25 DEATH — deceased
